# Patient Record
Sex: Male | Born: 1965 | Race: Black or African American | Hispanic: No | Marital: Single | Smoking: Never smoker
Health system: Southern US, Community
[De-identification: ages and names within clinical notes are randomized; demographics above are authoritative.]

## PROBLEM LIST (undated history)

## (undated) DIAGNOSIS — E119 Type 2 diabetes mellitus without complications: Secondary | ICD-10-CM

## (undated) DIAGNOSIS — I10 Essential (primary) hypertension: Secondary | ICD-10-CM

## (undated) DIAGNOSIS — K219 Gastro-esophageal reflux disease without esophagitis: Secondary | ICD-10-CM

## (undated) DIAGNOSIS — M1990 Unspecified osteoarthritis, unspecified site: Secondary | ICD-10-CM

## (undated) DIAGNOSIS — M109 Gout, unspecified: Secondary | ICD-10-CM

## (undated) HISTORY — DX: Unspecified osteoarthritis, unspecified site: M19.90

## (undated) HISTORY — DX: Gout, unspecified: M10.9

## (undated) HISTORY — DX: Essential (primary) hypertension: I10

## (undated) HISTORY — PX: DG OPERATIVE RIGHT HIP (ARMC HX): HXRAD1581

---

## 2006-06-29 ENCOUNTER — Emergency Department: Payer: Self-pay | Admitting: Emergency Medicine

## 2010-01-18 ENCOUNTER — Encounter: Admission: RE | Admit: 2010-01-18 | Discharge: 2010-01-18 | Payer: Self-pay | Admitting: Internal Medicine

## 2011-09-11 ENCOUNTER — Emergency Department: Payer: Self-pay | Admitting: Emergency Medicine

## 2014-02-01 ENCOUNTER — Ambulatory Visit: Payer: Self-pay | Admitting: Internal Medicine

## 2014-07-20 ENCOUNTER — Ambulatory Visit: Payer: Self-pay

## 2014-07-20 LAB — CBC WITH DIFFERENTIAL/PLATELET
BASOS ABS: 0.1 10*3/uL (ref 0.0–0.1)
BASOS PCT: 0.9 %
EOS ABS: 0.1 10*3/uL (ref 0.0–0.7)
Eosinophil %: 2.1 %
HCT: 41.1 % (ref 40.0–52.0)
HGB: 13.8 g/dL (ref 13.0–18.0)
Lymphocyte #: 3.1 10*3/uL (ref 1.0–3.6)
Lymphocyte %: 44.8 %
MCH: 31.6 pg (ref 26.0–34.0)
MCHC: 33.6 g/dL (ref 32.0–36.0)
MCV: 94 fL (ref 80–100)
MONO ABS: 0.3 x10 3/mm (ref 0.2–1.0)
Monocyte %: 4.3 %
Neutrophil #: 3.3 10*3/uL (ref 1.4–6.5)
Neutrophil %: 47.9 %
Platelet: 301 10*3/uL (ref 150–440)
RBC: 4.37 10*6/uL — AB (ref 4.40–5.90)
RDW: 13.5 % (ref 11.5–14.5)
WBC: 6.9 10*3/uL (ref 3.8–10.6)

## 2014-07-20 LAB — COMPREHENSIVE METABOLIC PANEL
ALT: 37 U/L
Albumin: 3.8 g/dL (ref 3.4–5.0)
Alkaline Phosphatase: 86 U/L
Anion Gap: 8 (ref 7–16)
BUN: 13 mg/dL (ref 7–18)
Bilirubin,Total: 1 mg/dL (ref 0.2–1.0)
CALCIUM: 9 mg/dL (ref 8.5–10.1)
CHLORIDE: 101 mmol/L (ref 98–107)
CO2: 27 mmol/L (ref 21–32)
CREATININE: 1.35 mg/dL — AB (ref 0.60–1.30)
EGFR (Non-African Amer.): >60
GLUCOSE: 146 mg/dL — AB (ref 65–99)
Osmolality: 275 (ref 275–301)
POTASSIUM: 3.9 mmol/L (ref 3.5–5.1)
SGOT(AST): 42 U/L — ABNORMAL HIGH (ref 15–37)
Sodium: 136 mmol/L (ref 136–145)
Total Protein: 9 g/dL — ABNORMAL HIGH (ref 6.4–8.2)

## 2014-07-20 LAB — URINALYSIS, COMPLETE
BILIRUBIN, UR: NEGATIVE
Bacteria: NEGATIVE
Glucose,UR: NEGATIVE
KETONE: NEGATIVE
Nitrite: NEGATIVE
PROTEIN: NEGATIVE
Ph: 5.5 (ref 5.0–8.0)
SPECIFIC GRAVITY: 1.02 (ref 1.000–1.030)

## 2015-04-10 ENCOUNTER — Encounter: Payer: Self-pay | Admitting: Emergency Medicine

## 2015-04-10 ENCOUNTER — Emergency Department
Admission: EM | Admit: 2015-04-10 | Discharge: 2015-04-10 | Disposition: A | Discharge status: Home / Self Care | Payer: BC Managed Care – PPO | Attending: Emergency Medicine | Admitting: Emergency Medicine

## 2015-04-10 DIAGNOSIS — M25572 Pain in left ankle and joints of left foot: Secondary | ICD-10-CM | POA: Diagnosis present

## 2015-04-10 DIAGNOSIS — M109 Gout, unspecified: Secondary | ICD-10-CM | POA: Insufficient documentation

## 2015-04-10 MED ORDER — INDOMETHACIN 50 MG PO CAPS: 50.0000 mg | ORAL_CAPSULE | Freq: Two times a day (BID) | ORAL | Status: DC

## 2015-04-10 MED ORDER — COLCHICINE 0.6 MG PO TABS
ORAL_TABLET | ORAL | Status: AC
  Administered 2015-04-10: 0.6 mg via ORAL
  Filled 2015-04-10: qty 1

## 2015-04-10 MED ORDER — INDOMETHACIN 25 MG PO CAPS
50.0000 mg | ORAL_CAPSULE | Freq: Once | ORAL | Status: AC
  Administered 2015-04-10: 50 mg via ORAL
  Filled 2015-04-10: qty 2

## 2015-04-10 MED ORDER — COLCHICINE 0.6 MG PO TABS
0.6000 mg | ORAL_TABLET | Freq: Once | ORAL | Status: AC
  Administered 2015-04-10: 0.6 mg via ORAL

## 2015-04-10 MED ORDER — COLCHICINE 0.6 MG PO TABS: 0.6000 mg | ORAL_TABLET | Freq: Every day | ORAL | Status: DC

## 2015-04-10 NOTE — ED Provider Notes (Signed)
[facility] Emergency Department Provider Note  ____________________________________________  Time seen: Approximately 3:41 AM  I have reviewed the triage vital signs and the nursing notes.   HISTORY  Chief Complaint Joint Pain    HPI Bill [name] is a 49 y.o. male with a history of gout who comes in with some left ankle swelling and pain. The patient reports that the pain started at 2100. The patient did not take anything for pain but reports that he fell asleep. He reports that when he woke up the pain was worse and he could not bear weight. The patient reports that this is typical of his gouty flares. The patient has had gout he reports 3 previous episodes. He reports that he typically gets the symptoms in his ankle. He denies any numbness or tingling or any other pain. He did not have any medication at home for this pain.   History reviewed. No pertinent past medical history.  There are no active problems to display for this patient.   History reviewed. No pertinent past surgical history.  No current outpatient prescriptions on file.  Allergies Review of patient's allergies indicates no known allergies.  No family history on file.  Social History History  Substance Use Topics  . Smoking status: Never Smoker   . Smokeless tobacco: Not on file  . Alcohol Use: No    Review of Systems Constitutional: No fever/chills Eyes: No visual changes. ENT: No sore throat. Cardiovascular: Denies chest pain. Respiratory: Denies shortness of breath. Gastrointestinal: No abdominal pain.  No nausea, no vomiting.   Genitourinary: Negative for dysuria. Musculoskeletal: Left ankle pain Skin: Negative for rash. Neurological: Negative for headaches,  10-point ROS otherwise negative.  ____________________________________________   PHYSICAL EXAM:  VITAL SIGNS: ED Triage Vitals  Enc Vitals Group     BP 04/10/15 0145 180/112 mmHg     Pulse Rate 04/10/15  0145 118     Resp 04/10/15 0145 18     Temp 04/10/15 0145 98.2 F (36.8 C)     Temp Source 04/10/15 0145 Oral     SpO2 04/10/15 0145 98 %     Weight 04/10/15 0145 260 lb (117.935 kg)     Height 04/10/15 0145 5\' 11"  (1.803 m)     Head Cir --      Peak Flow --      Pain Score 04/10/15 0146 8     Pain Loc --      Pain Edu? --      Excl. in [place]? --     Constitutional: Alert and oriented. Well appearing and in no acute distress. Eyes: Conjunctivae are normal. PERRL. EOMI. Head: Atraumatic. Nose: No congestion/rhinnorhea. Mouth/Throat: Mucous membranes are moist.  Oropharynx non-erythematous. Cardiovascular: Normal rate, regular rhythm. Grossly normal heart sounds.  Good peripheral circulation. Respiratory: Normal respiratory effort.  No retractions. Lungs CTAB. Gastrointestinal: Soft and nontender. No distention. No abdominal bruits. No CVA tenderness. Genitourinary: Deferred Musculoskeletal: A and in the left lateral ankle with palpation and range of motion. Mild swelling Neurologic:  Normal speech and language. No gross focal neurologic deficits are appreciated.  Skin:  Skin is warm, dry and intact. No rash noted. Psychiatric: Mood and affect are normal.   ____________________________________________   LABS (all labs ordered are listed, but only abnormal results are displayed)  Labs Reviewed - No data to display ____________________________________________  EKG  None ____________________________________________  RADIOLOGY  None ____________________________________________   PROCEDURES  Procedure(s) performed: None  Critical Care performed:  No  ____________________________________________   INITIAL IMPRESSION / ASSESSMENT AND PLAN / ED COURSE  Pertinent labs & imaging results that were available during my care of the patient were reviewed by me and considered in my medical decision making (see chart for details).  This is a 49 year old male with a history of  gout who comes in today with some left-sided ankle pain and mild swelling which is consistent with his previous gout flares. I will give the patient a dose of indomethacin and culture seen and discharge the patient home with the same medications. The patient is here on crutches for comfort. The patient be discharged follow-up with his primary care physician.  The patient's heart rate is improved. ____________________________________________   FINAL CLINICAL IMPRESSION(S) / ED DIAGNOSES  Final diagnoses:  Gout       Loney Hering, MD 04/10/15 [phone]

## 2015-04-10 NOTE — Discharge Instructions (Signed)

## 2015-04-10 NOTE — ED Notes (Signed)
Patient reports left foot pain.  Pt reports history of gout.

## 2015-04-10 NOTE — ED Notes (Signed)
Pt sitting up on side of stretcher in exam room with no distress noted; reports pain/swelling to left foot since 9pm with no known injury; st hx of same with gout; +PP, brisk cap refill, W&D with good movem/sensation

## 2015-12-01 ENCOUNTER — Ambulatory Visit (INDEPENDENT_AMBULATORY_CARE_PROVIDER_SITE_OTHER): Payer: BC Managed Care – PPO | Admitting: Family Medicine

## 2015-12-01 ENCOUNTER — Encounter: Payer: Self-pay | Admitting: Family Medicine

## 2015-12-01 VITALS — BP 180/88 | HR 90 | Temp 98.3°F | Resp 16 | Ht 71.0 in | Wt 262.8 lb

## 2015-12-01 DIAGNOSIS — Z9109 Other allergy status, other than to drugs and biological substances: Secondary | ICD-10-CM | POA: Insufficient documentation

## 2015-12-01 DIAGNOSIS — Z91048 Other nonmedicinal substance allergy status: Secondary | ICD-10-CM | POA: Diagnosis not present

## 2015-12-01 DIAGNOSIS — Z125 Encounter for screening for malignant neoplasm of prostate: Secondary | ICD-10-CM | POA: Diagnosis not present

## 2015-12-01 DIAGNOSIS — Z8342 Family history of familial hypercholesterolemia: Secondary | ICD-10-CM

## 2015-12-01 DIAGNOSIS — M10072 Idiopathic gout, left ankle and foot: Secondary | ICD-10-CM | POA: Diagnosis not present

## 2015-12-01 DIAGNOSIS — I10 Essential (primary) hypertension: Secondary | ICD-10-CM | POA: Diagnosis not present

## 2015-12-01 DIAGNOSIS — M1A9XX0 Chronic gout, unspecified, without tophus (tophi): Secondary | ICD-10-CM | POA: Insufficient documentation

## 2015-12-01 MED ORDER — AMLODIPINE BESYLATE 5 MG PO TABS: 5.0000 mg | ORAL_TABLET | Freq: Every day | ORAL | Status: DC

## 2015-12-01 MED ORDER — BLOOD PRESSURE CUFF MISC: 1.0000 | Freq: Every day | Status: DC

## 2015-12-01 MED ORDER — INDOMETHACIN 50 MG PO CAPS: 50.0000 mg | ORAL_CAPSULE | Freq: Two times a day (BID) | ORAL | Status: AC

## 2015-12-01 NOTE — Patient Instructions (Signed)
Your goal blood pressure is 140/90 (100/60- 140-90) Work on low salt/sodium diet - goal <2.5gm (12,500mg ) per day. Eat a diet high in fruits/vegetables and whole grains.  Look into mediterranean and DASH diet. Goal activity is 177min/wk of moderate intensity exercise.  This can be split into 30 minute chunks.  If you are not at this level, you can start with smaller 10-15 min increments and slowly build up activity. Look at [employer]org for more resources   Please seek immediate medical attention at ER or Urgent Care if you develop: Chest pain, pressure or tightness. Shortness of breath accompanied by nausea or diaphoresis Visual changes Numbness or tingling on one side of the body Facial droop Altered mental status Or any concerning symptoms.

## 2015-12-01 NOTE — Assessment & Plan Note (Signed)
Start therapy today. CCB due to AAM.  Check CMP. Reviewed DASH diet. Encouraged exercise.  RTc 2 weeks for BP check. Consider ACE/ARB for renal protection.

## 2015-12-01 NOTE — Assessment & Plan Note (Signed)
Continue current medications to help soothe flare. Check Uric acid levels to determine if prophylaxis is needed.

## 2015-12-01 NOTE — Progress Notes (Signed)
Subjective:    Patient ID: Bill [name], male    DOB: 1966/07/24, 50 y.o.   MRN: [id number]  HPI: Bill [name] is a 50 y.o. male presenting on 12/01/2015 for Establish Care   HPI  Pt presents to establish care today. Previous care provider was NONE.  It has been yearsyears since His last PCP visit. Records from previous provider will be requested and reviewed. Current medical problems include:  Gout: Recent gout flare- seen at [facility]. 3rd gout flare in lifetime. 2 in the past 6 mos. Taking indomethicin BID for gout flare. L ankle-swollen but pain is improving. Will finish course on 1/31 Hypertension: Diagnosed recently at [facility]. No Chest pain, SOB, or visual changes. Never been on medication.  Health maintenance:  Would like to screen PSA.  Cologuard info given.  TDAP- >10 years.   Allergies to dogs that shed- no trouble breathing. Runny eyes. No dogs in the house. Probation office-    Past Medical History  Diagnosis Date  . Hypertension   . Gout    Social History   Social History  . Marital Status: Unknown    Spouse Name: N/A  . Number of Children: N/A  . Years of Education: N/A   Occupational History  . Not on file.   Social History Main Topics  . Smoking status: Never Smoker   . Smokeless tobacco: Not on file  . Alcohol Use: No  . Drug Use: No  . Sexual Activity: Not on file   Other Topics Concern  . Not on file   Social History Narrative   Family History  Problem Relation Age of Onset  . Diabetes Father    No current outpatient prescriptions on file prior to visit.   No current facility-administered medications on file prior to visit.    Review of Systems  Constitutional: Negative for fever and chills.  HENT: Negative.   Respiratory: Negative for chest tightness, shortness of breath and wheezing.   Cardiovascular: Negative for chest pain, palpitations and leg swelling.  Gastrointestinal: Negative for nausea, vomiting and abdominal pain.    Endocrine: Negative.   Genitourinary: Negative for dysuria, urgency, discharge, penile pain and testicular pain.  Musculoskeletal: Positive for joint swelling (L ankle.) and arthralgias. Negative for back pain.  Skin: Negative.   Neurological: Negative for dizziness, weakness, numbness and headaches.  Psychiatric/Behavioral: Negative for sleep disturbance and dysphoric mood.   Per HPI unless specifically indicated above     Objective:    BP 180/88 mmHg  Pulse 90  Temp(Src) 98.3 F (36.8 C) (Oral)  Resp 16  Ht '5\' 11"'  (1.803 m)  Wt 262 lb 12.8 oz (119.205 kg)  BMI 36.67 kg/m2  Wt Readings from Last 3 Encounters:  12/01/15 262 lb 12.8 oz (119.205 kg)  04/10/15 260 lb (117.935 kg)    Physical Exam  Constitutional: He is oriented to person, place, and time. He appears well-developed and well-nourished. No distress.  HENT:  Head: Normocephalic and atraumatic.  Neck: Neck supple. No thyromegaly present.  Cardiovascular: Normal rate, regular rhythm and normal heart sounds.  Exam reveals no gallop and no friction rub.   No murmur heard. Pulmonary/Chest: Effort normal and breath sounds normal. He has no wheezes.  Abdominal: Soft. Bowel sounds are normal. He exhibits no distension. There is no tenderness. There is no rebound.  Musculoskeletal: Normal range of motion. He exhibits no edema.       Left ankle: He exhibits swelling. He exhibits normal range of  motion, no ecchymosis, no deformity and normal pulse. Tenderness. Lateral malleolus tenderness found.  Neurological: He is alert and oriented to person, place, and time. He has normal reflexes.  Skin: Skin is warm and dry. No rash noted. No erythema.  Psychiatric: He has a normal mood and affect. His behavior is normal. Thought content normal.   Results for orders placed or performed in visit on 07/20/14  CBC with Differential/Platelet  Result Value Ref Range   WBC 6.9 3.8-10.6 x10 3/mm 3   RBC 4.37 (L) 4.40-5.90 x10 6/mm 3   HGB  13.8 13.0-18.0 g/dL   HCT 41.1 40.0-52.0 %   MCV 94 80-100 fL   MCH 31.6 26.0-34.0 pg   MCHC 33.6 32.0-36.0 g/dL   RDW 13.5 11.5-14.5 %   Platelet 301 150-440 x10 3/mm 3   Neutrophil % 47.9 %   Lymphocyte % 44.8 %   Monocyte % 4.3 %   Eosinophil % 2.1 %   Basophil % 0.9 %   Neutrophil # 3.3 1.4-6.5 x10 3/mm 3   Lymphocyte # 3.1 1.0-3.6 x10 3/mm 3   Monocyte # 0.3 0.2-1.0 x10 3/mm    Eosinophil # 0.1 0.0-0.7 x10 3/mm 3   Basophil # 0.1 0.0-0.1 x10 3/mm 3  Comprehensive metabolic panel  Result Value Ref Range   Glucose 146 (H) 65-99 mg/dL   BUN 13 7-18 mg/dL   Creatinine 1.35 (H) 0.60-1.30 mg/dL   Sodium 136 136-145 mmol/L   Potassium 3.9 3.5-5.1 mmol/L   Chloride 101 98-107 mmol/L   Co2 27 21-32 mmol/L   Calcium, Total 9.0 8.5-10.1 mg/dL   SGOT(AST) 42 (H) 15-37 Unit/L   SGPT (ALT) 37 U/L   Alkaline Phosphatase 86 Unit/L   Albumin 3.8 3.4-5.0 g/dL   Total Protein 9.0 (H) 6.4-8.2 g/dL   Bilirubin,Total 1.0 0.2-1.0 mg/dL   Osmolality 275 275-301   Anion Gap 8 7-16   EGFR (African American) >60    EGFR (Non-African Amer.) >60   Urinalysis, Complete  Result Value Ref Range   Color - urine YELLOW    Clarity - urine HAZY    Glucose,UR NEGATIVE NEGATIVE   Bilirubin,UR NEGATIVE NEGATIVE   Ketone NEGATIVE NEGATIVE   Specific Gravity 1.020 1.000-1.030   Blood TRACE NEGATIVE   Ph 5.5 5.0-8.0   Protein NEGATIVE NEGATIVE   Nitrite NEGATIVE NEGATIVE   Leukocyte Esterase TRACE NEGATIVE   RBC,UR RARE 0-5 /HPF   WBC UR 5-15 0-5 /HPF   Bacteria NEGATIVE NONE SEEN   Squamous Epithelial 5-15 / HPF       Assessment & Plan:   Problem List Items Addressed This Visit      Cardiovascular and Mediastinum   Hypertension    Start therapy today. CCB due to AAM.  Check CMP. Reviewed DASH diet. Encouraged exercise.  RTc 2 weeks for BP check. Consider ACE/ARB for renal protection.       Relevant Medications   amLODipine (NORVASC) 5 MG tablet   Blood Pressure Monitoring (BLOOD  PRESSURE CUFF) MISC     Other   Acute idiopathic gout - Primary    Continue current medications to help soothe flare. Check Uric acid levels to determine if prophylaxis is needed.       Relevant Medications   indomethacin (INDOCIN) 50 MG capsule   Other Relevant Orders   Comprehensive Metabolic Panel (CMET)   Uric acid   Environmental allergies    PRN OTC claritin as needed.       Other Visit Diagnoses  Prostate cancer screening        Shared decision making used. Pt would like to screen PSA.     Relevant Orders    PSA    Family history of high cholesterol        Relevant Orders    Lipid Profile       Meds ordered this encounter  Medications  . colchicine 0.6 MG tablet    Sig: Take 1 tablet in 1 hour. May repeat 2 tablets tomorrow and another 1 tablet 1 hour later PRN for pain  . DISCONTD: indomethacin (INDOCIN) 50 MG capsule    Sig: Take 50 mg by mouth.  . indomethacin (INDOCIN) 50 MG capsule    Sig: Take 1 capsule (50 mg total) by mouth 2 (two) times daily with a meal.    Dispense:  10 capsule    Refill:  1    Order Specific Question:  Supervising Provider    Answer:  JANMICHAEL, GIRAUD [phone]  . amLODipine (NORVASC) 5 MG tablet    Sig: Take 1 tablet (5 mg total) by mouth daily.    Dispense:  30 tablet    Refill:  11    Order Specific Question:  Supervising Provider    Answer:  Arlis Porta [phone]  . Blood Pressure Monitoring (BLOOD PRESSURE CUFF) MISC    Sig: 1 each by Does not apply route daily.    Dispense:  1 each    Refill:  0    Order Specific Question:  Supervising Provider    Answer:  YEHYA, BRENDLE [[id number]]      Follow up plan: Return in about 2 weeks (around 12/15/2015) for BP check.Marland Kitchen

## 2015-12-01 NOTE — Assessment & Plan Note (Signed)
PRN OTC claritin as needed.

## 2015-12-02 LAB — COMPREHENSIVE METABOLIC PANEL
ALBUMIN: 4 g/dL (ref 3.5–5.5)
ALT: 33 IU/L (ref 0–44)
AST: 56 IU/L — ABNORMAL HIGH (ref 0–40)
Albumin/Globulin Ratio: 1 — ABNORMAL LOW (ref 1.1–2.5)
Alkaline Phosphatase: 75 IU/L (ref 39–117)
BILIRUBIN TOTAL: 0.7 mg/dL (ref 0.0–1.2)
BUN/Creatinine Ratio: 11 (ref 9–20)
BUN: 12 mg/dL (ref 6–24)
CO2: 25 mmol/L (ref 18–29)
CREATININE: 1.05 mg/dL (ref 0.76–1.27)
Calcium: 9.4 mg/dL (ref 8.7–10.2)
Chloride: 101 mmol/L (ref 96–106)
GFR, EST AFRICAN AMERICAN: 96 mL/min/{1.73_m2} (ref >59)
GFR, EST NON AFRICAN AMERICAN: 83 mL/min/{1.73_m2} (ref >59)
GLOBULIN, TOTAL: 4.2 g/dL (ref 1.5–4.5)
GLUCOSE: 135 mg/dL — AB (ref 65–99)
POTASSIUM: 4.2 mmol/L (ref 3.5–5.2)
Sodium: 142 mmol/L (ref 134–144)
TOTAL PROTEIN: 8.2 g/dL (ref 6.0–8.5)

## 2015-12-02 LAB — PSA: Prostate Specific Ag, Serum: 0.3 ng/mL (ref 0.0–4.0)

## 2015-12-02 LAB — LIPID PANEL
CHOL/HDL RATIO: 5.9 ratio — AB (ref 0.0–5.0)
Cholesterol, Total: 224 mg/dL — ABNORMAL HIGH (ref 100–199)
HDL: 38 mg/dL — AB (ref >39)
LDL CALC: 160 mg/dL — AB (ref 0–99)
TRIGLYCERIDES: 128 mg/dL (ref 0–149)
VLDL Cholesterol Cal: 26 mg/dL (ref 5–40)

## 2015-12-02 LAB — URIC ACID: URIC ACID: 8.9 mg/dL — AB (ref 3.7–8.6)

## 2016-01-05 ENCOUNTER — Ambulatory Visit (INDEPENDENT_AMBULATORY_CARE_PROVIDER_SITE_OTHER): Payer: BC Managed Care – PPO | Admitting: Family Medicine

## 2016-01-05 ENCOUNTER — Encounter: Payer: Self-pay | Admitting: Family Medicine

## 2016-01-05 VITALS — BP 163/97 | HR 80 | Temp 98.2°F | Resp 16 | Ht 71.0 in | Wt 257.0 lb

## 2016-01-05 DIAGNOSIS — M10072 Idiopathic gout, left ankle and foot: Secondary | ICD-10-CM

## 2016-01-05 DIAGNOSIS — R7301 Impaired fasting glucose: Secondary | ICD-10-CM | POA: Diagnosis not present

## 2016-01-05 DIAGNOSIS — R7401 Elevation of levels of liver transaminase levels: Secondary | ICD-10-CM

## 2016-01-05 DIAGNOSIS — E785 Hyperlipidemia, unspecified: Secondary | ICD-10-CM | POA: Diagnosis not present

## 2016-01-05 DIAGNOSIS — R740 Nonspecific elevation of levels of transaminase and lactic acid dehydrogenase [LDH]: Secondary | ICD-10-CM

## 2016-01-05 DIAGNOSIS — I10 Essential (primary) hypertension: Secondary | ICD-10-CM

## 2016-01-05 MED ORDER — ALLOPURINOL 100 MG PO TABS: 100.0000 mg | ORAL_TABLET | Freq: Every day | ORAL | Status: DC

## 2016-01-05 MED ORDER — COLCHICINE 0.6 MG PO TABS: 0.6000 mg | ORAL_TABLET | Freq: Every day | ORAL | Status: DC

## 2016-01-05 MED ORDER — AMLODIPINE BESYLATE 10 MG PO TABS: 10.0000 mg | ORAL_TABLET | Freq: Every day | ORAL | Status: DC

## 2016-01-05 NOTE — Patient Instructions (Signed)
Gout: We will start allopurinol 100mg  once daily.  Take colchicine 0.6mg  once daily to prevent a gout flare.  We will recheck your uric acid and kidney function in 1 mos.  BP: Increase your amlodipine to 10mg  daily.  Take 2 pills until your current bottle is empty and switch to new prescription.  We will recheck your lab work.   Your goal blood pressure is 140/90 Work on low salt/sodium diet - goal <1.5gm (1,500mg ) per day. Eat a diet high in fruits/vegetables and whole grains.  Look into mediterranean and DASH diet. Goal activity is 180min/wk of moderate intensity exercise.  This can be split into 30 minute chunks.  If you are not at this level, you can start with smaller 10-15 min increments and slowly build up activity. Look at [place].org for more resources  Please seek immediate medical attention at ER or Urgent Care if you develop: Chest pain, pressure or tightness. Shortness of breath accompanied by nausea or diaphoresis Visual changes Numbness or tingling on one side of the body Facial droop Altered mental status Or any concerning symptoms.

## 2016-01-05 NOTE — Progress Notes (Signed)
Subjective:    Patient ID: Bill [name], male    DOB: September 18, 1966, 50 y.o.   MRN: [id number]  HPI: Bill [name] is a 50 y.o. male presenting on 01/05/2016 for Follow-up   HPI  Pt presents for follow-up of HTN and gout. He was also found to have elevated cholesterol on labwork.  HTN: No chest pain, SOB, or visual changes. Tolerating amlodipine well. Trying to make diet and lifestyle changes. Taking medication daily. Gout: Recent flare resolved with indomethicin. Doing well. Uric acid levels were high- would like to start gout prophylaxis today.    Past Medical History  Diagnosis Date  . Hypertension   . Gout     Current Outpatient Prescriptions on File Prior to Visit  Medication Sig  . Blood Pressure Monitoring (BLOOD PRESSURE CUFF) MISC 1 each by Does not apply route daily.   No current facility-administered medications on file prior to visit.    Review of Systems  Constitutional: Negative for fever and chills.  HENT: Negative.   Respiratory: Negative for chest tightness, shortness of breath and wheezing.   Cardiovascular: Negative for chest pain, palpitations and leg swelling.  Gastrointestinal: Negative for nausea, vomiting and abdominal pain.  Endocrine: Negative.   Genitourinary: Negative for dysuria, urgency, discharge, penile pain and testicular pain.  Musculoskeletal: Negative for back pain, joint swelling and arthralgias.  Skin: Negative.   Neurological: Negative for dizziness, weakness, numbness and headaches.  Psychiatric/Behavioral: Negative for sleep disturbance and dysphoric mood.   Per HPI unless specifically indicated above     Objective:    BP 163/97 mmHg  Pulse 80  Temp(Src) 98.2 F (36.8 C) (Oral)  Resp 16  Ht 5\' 11"  (1.803 m)  Wt 257 lb (116.574 kg)  BMI 35.86 kg/m2  Wt Readings from Last 3 Encounters:  01/05/16 257 lb (116.574 kg)  12/01/15 262 lb 12.8 oz (119.205 kg)  04/10/15 260 lb (117.935 kg)    Physical Exam  Constitutional: He is  oriented to person, place, and time. He appears well-developed and well-nourished. No distress.  HENT:  Head: Normocephalic and atraumatic.  Neck: Neck supple. No thyromegaly present.  Cardiovascular: Normal rate, regular rhythm and normal heart sounds.  Exam reveals no gallop and no friction rub.   No murmur heard. Pulmonary/Chest: Effort normal and breath sounds normal. He has no wheezes.  Abdominal: Soft. Bowel sounds are normal. He exhibits no distension. There is no tenderness. There is no rebound.  Musculoskeletal: Normal range of motion. He exhibits no edema or tenderness.  Neurological: He is alert and oriented to person, place, and time. He has normal reflexes.  Skin: Skin is warm and dry. No rash noted. No erythema.  Psychiatric: He has a normal mood and affect. His behavior is normal. Thought content normal.   Results for orders placed or performed in visit on 12/01/15  Comprehensive Metabolic Panel (CMET)  Result Value Ref Range   Glucose 135 (H) 65 - 99 mg/dL   BUN 12 6 - 24 mg/dL   Creatinine, Ser 1.05 0.76 - 1.27 mg/dL   GFR calc non Af Amer 83 >59 mL/min/1.73   GFR calc Af Amer 96 >59 mL/min/1.73   BUN/Creatinine Ratio 11 9 - 20   Sodium 142 134 - 144 mmol/L   Potassium 4.2 3.5 - 5.2 mmol/L   Chloride 101 96 - 106 mmol/L   CO2 25 18 - 29 mmol/L   Calcium 9.4 8.7 - 10.2 mg/dL   Total Protein 8.2 6.0 -  8.5 g/dL   Albumin 4.0 3.5 - 5.5 g/dL   Globulin, Total 4.2 1.5 - 4.5 g/dL   Albumin/Globulin Ratio 1.0 (L) 1.1 - 2.5   Bilirubin Total 0.7 0.0 - 1.2 mg/dL   Alkaline Phosphatase 75 39 - 117 IU/L   AST 56 (H) 0 - 40 IU/L   ALT 33 0 - 44 IU/L  Lipid Profile  Result Value Ref Range   Cholesterol, Total 224 (H) 100 - 199 mg/dL   Triglycerides 128 0 - 149 mg/dL   HDL 38 (L) >39 mg/dL   VLDL Cholesterol Cal 26 5 - 40 mg/dL   LDL Calculated 160 (H) 0 - 99 mg/dL   Chol/HDL Ratio 5.9 (H) 0.0 - 5.0 ratio units  Uric acid  Result Value Ref Range   Uric Acid 8.9 (H) 3.7  - 8.6 mg/dL  PSA  Result Value Ref Range   Prostate Specific Ag, Serum 0.3 0.0 - 4.0 ng/mL      Assessment & Plan:   Problem List Items Addressed This Visit      Cardiovascular and Mediastinum   Hypertension    Increase amlodipine to 10mg  daily. Reviewed dash diet and lifestyle changes. Alarm symptoms reviewed. Recheck 1 mos.       Relevant Medications   amLODipine (NORVASC) 10 MG tablet     Other   Chronic gout - Primary    Start prophylaxis due to hyperurcemia. Allopurinol 100mg  once daily and colchicine 0.6mg  daily. Reviewed risks,benefits, and side effects.  Plan to increase to 300mg  daily as tolerated. If gout flare occurs increase to BID colchicine.       Relevant Medications   colchicine 0.6 MG tablet   allopurinol (ZYLOPRIM) 100 MG tablet   Hyperlipemia    Plan to start atorvastatin 20mg  pending LFts. Discussed risks, benefits, and side effects.       Relevant Medications   amLODipine (NORVASC) 10 MG tablet    Other Visit Diagnoses    Elevated fasting glucose        Screen A1c.     Relevant Orders    Hemoglobin A1c    Elevated transaminase level        Recheck LFTs.    Relevant Orders    Hepatic function panel       Meds ordered this encounter  Medications  . colchicine 0.6 MG tablet    Sig: Take 1 tablet (0.6 mg total) by mouth daily.    Dispense:  30 tablet    Refill:  11    Order Specific Question:  Supervising Provider    Answer:  Arlis Porta [phone]  . allopurinol (ZYLOPRIM) 100 MG tablet    Sig: Take 1 tablet (100 mg total) by mouth daily.    Dispense:  30 tablet    Refill:  11    Order Specific Question:  Supervising Provider    Answer:  Arlis Porta [phone]  . amLODipine (NORVASC) 10 MG tablet    Sig: Take 1 tablet (10 mg total) by mouth daily.    Dispense:  90 tablet    Refill:  3    Order Specific Question:  Supervising Provider    Answer:  DAVINDER, MIGNEAULT [id number]      Follow up plan: Return in about 4 weeks  (around 02/02/2016), or if symptoms worsen or fail to improve, for Gout.

## 2016-01-05 NOTE — Assessment & Plan Note (Signed)
Plan to start atorvastatin 20mg  pending LFts. Discussed risks, benefits, and side effects.

## 2016-01-05 NOTE — Assessment & Plan Note (Signed)
Increase amlodipine to 10mg  daily. Reviewed dash diet and lifestyle changes. Alarm symptoms reviewed. Recheck 1 mos.

## 2016-01-05 NOTE — Assessment & Plan Note (Signed)
Start prophylaxis due to hyperurcemia. Allopurinol 100mg  once daily and colchicine 0.6mg  daily. Reviewed risks,benefits, and side effects.  Plan to increase to 300mg  daily as tolerated. If gout flare occurs increase to BID colchicine.

## 2016-12-26 ENCOUNTER — Ambulatory Visit (INDEPENDENT_AMBULATORY_CARE_PROVIDER_SITE_OTHER): Payer: BC Managed Care – PPO | Admitting: Family Medicine

## 2016-12-26 ENCOUNTER — Encounter: Payer: Self-pay | Admitting: Family Medicine

## 2016-12-26 VITALS — BP 170/100 | HR 95 | Temp 98.8°F | Resp 16 | Ht 71.0 in | Wt 266.0 lb

## 2016-12-26 DIAGNOSIS — M25551 Pain in right hip: Secondary | ICD-10-CM

## 2016-12-26 DIAGNOSIS — I10 Essential (primary) hypertension: Secondary | ICD-10-CM

## 2016-12-26 DIAGNOSIS — M1651 Unilateral post-traumatic osteoarthritis, right hip: Secondary | ICD-10-CM

## 2016-12-26 DIAGNOSIS — Z8781 Personal history of (healed) traumatic fracture: Secondary | ICD-10-CM

## 2016-12-26 DIAGNOSIS — M25561 Pain in right knee: Secondary | ICD-10-CM | POA: Diagnosis not present

## 2016-12-26 DIAGNOSIS — G8929 Other chronic pain: Secondary | ICD-10-CM | POA: Diagnosis not present

## 2016-12-26 MED ORDER — AMLODIPINE BESYLATE 10 MG PO TABS: 10.0000 mg | ORAL_TABLET | Freq: Every day | ORAL | 3 refills | Status: DC

## 2016-12-26 MED ORDER — NAPROXEN 500 MG PO TABS: 500.0000 mg | ORAL_TABLET | Freq: Two times a day (BID) | ORAL | 2 refills | Status: DC
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
## 2016-12-27 ENCOUNTER — Ambulatory Visit: Payer: BC Managed Care – PPO | Admitting: Family Medicine

## 2016-12-27 ENCOUNTER — Encounter: Payer: Self-pay | Admitting: Family Medicine

## 2016-12-27 NOTE — Assessment & Plan Note (Signed)
Likely etiology for chronic post-op / arthritis pain. See A&P

## 2016-12-27 NOTE — Assessment & Plan Note (Signed)
Significantly elevated BP, mild improved on manual re-check but still elevated. Non adherence to amlodipine. Pain with R hip/knee - No known complications  Plan: 1. Refilled Amlodipine, resume 10mg  daily for now 2. Monitor BP outside office, write readings, if persistently >160/100, call or return sooner for BP re-check 3. Encouraged healthy lifestyle, reviewed HTN factors, concern with pain and limited exercise for now 4. Follow-up 4-6 weeks for HTN, likely will need 2nd agent for BP
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
## 2016-12-27 NOTE — Assessment & Plan Note (Signed)
Likely etiology of osteoarthritis contributing to R hip pain in post-op setting

## 2017-01-02 ENCOUNTER — Ambulatory Visit: Payer: BC Managed Care – PPO | Admitting: Family Medicine

## 2018-09-18 ENCOUNTER — Other Ambulatory Visit: Payer: Self-pay | Admitting: Family Medicine

## 2018-09-18 DIAGNOSIS — I10 Essential (primary) hypertension: Secondary | ICD-10-CM

## 2018-09-18 NOTE — Telephone Encounter (Signed)
Pt called requesting refill on BP medication called in [facility]

## 2018-09-22 MED ORDER — AMLODIPINE BESYLATE 10 MG PO TABS: 10.0000 mg | ORAL_TABLET | Freq: Every day | ORAL | 0 refills | Status: DC

## 2018-11-23 ENCOUNTER — Telehealth: Payer: Self-pay | Admitting: Family Medicine

## 2018-11-23 NOTE — Telephone Encounter (Signed)
Pt called requesting refill for  Mediation for gout called into [facility].Pt call back # is [phone]

## 2018-11-23 NOTE — Telephone Encounter (Signed)
Reviewed chart, last seen 12/2016. He will need office visit for any refills, need to discuss his gout and treatment options.  Nobie Putnam, [facility] 11/23/2018, 4:59 PM

## 2019-04-04 ENCOUNTER — Inpatient Hospital Stay: Admission: RE | Admit: 2019-04-04 | Payer: BC Managed Care – PPO | Source: Ambulatory Visit

## 2019-04-04 ENCOUNTER — Ambulatory Visit (INDEPENDENT_AMBULATORY_CARE_PROVIDER_SITE_OTHER)
Admission: RE | Admit: 2019-04-04 | Discharge: 2019-04-04 | Disposition: A | Discharge status: Home / Self Care | Payer: BC Managed Care – PPO | Source: Ambulatory Visit

## 2019-04-04 ENCOUNTER — Inpatient Hospital Stay
Admission: RE | Admit: 2019-04-04 | Discharge: 2019-04-04 | Disposition: A | Discharge status: Home / Self Care | Payer: BC Managed Care – PPO | Source: Ambulatory Visit

## 2019-04-04 DIAGNOSIS — M109 Gout, unspecified: Secondary | ICD-10-CM

## 2019-04-04 MED ORDER — INDOMETHACIN 50 MG PO CAPS: 50.0000 mg | ORAL_CAPSULE | Freq: Two times a day (BID) | ORAL | 0 refills | Status: AC

## 2019-04-04 NOTE — ED Provider Notes (Signed)
Virtual Visit via Video Note:  Bill [name]  initiated request for Telemedicine visit with [facility] Urgent Care team. I connected with Bill [name]  on 04/04/2019 at 2:29 PM  for a synchronized telemedicine visit using a video enabled HIPPA compliant telemedicine application. I verified that I am speaking with Bill [name]  using two identifiers. Zigmund Gottron, NP  was physically located in a [facility] Urgent care site and Bill [name] was located at a different location.   The limitations of evaluation and management by telemedicine as well as the availability of in-person appointments were discussed. Patient was informed that he  may incur a bill ( including co-pay) for this virtual visit encounter. Bill [name]  expressed understanding and gave verbal consent to proceed with virtual visit.     History of Present Illness:Bill [name]  is a 53 y.o. male presents with complaints of gout flair. Foot pain. Started yesterday. Left ankle. No injury. Has had gout in varying locations which feels similar. Swollen, no redness. No fevers. No numbness tingling weakness to food. 9/10. Tylenol, which didn't help. Last episode of gout approximately 1 year ago. No known triggers. No alcohol intake in the past 1 month. Drinking plenty of water. Pain with weight bearing.   Past Medical History:  Diagnosis Date  . Gout   . Hypertension     No Known Allergies      Observations/Objective: Alert, oriented, non toxic in appearance. Clear coherent speech without difficulty. No increased work of breathing visualized.  Ankle visualized with apparent swelling, no obvious redness, moving toes without difficulty.   Assessment and Plan: Consistent with gout. Will treat with with indomethacin, as patient states this has helped most significantly in the past. Return precautions provided. Encouraged follow up for persistent or worsening of symptoms. Patient verbalized understanding and agreeable to plan.     Follow Up Instructions:    I discussed the assessment and treatment plan with the patient. The patient was provided an opportunity to ask questions and all were answered. The patient agreed with the plan and demonstrated an understanding of the instructions.   The patient was advised to call back or seek an in-person evaluation if the symptoms worsen or if the condition fails to improve as anticipated.  I provided 13 minutes of non-face-to-face time during this encounter.    Zigmund Gottron, NP  04/04/2019 2:29 PM         Zigmund Gottron, NP 04/04/19 1431

## 2019-04-04 NOTE — Discharge Instructions (Signed)
Ice, elevation to help with with pain.  Indomethacin twice a day, take with food. Stop taking once symptoms have improved.  May use tylenol as well as needed. See attached diet recommendations to help with gout prevention.  If persistent symptoms, fever development, warmth redness or otherwise worsening please be seen in person.

## 2019-05-17 ENCOUNTER — Ambulatory Visit (INDEPENDENT_AMBULATORY_CARE_PROVIDER_SITE_OTHER)
Admission: RE | Admit: 2019-05-17 | Discharge: 2019-05-17 | Disposition: A | Discharge status: Home / Self Care | Payer: BC Managed Care – PPO | Source: Ambulatory Visit

## 2019-05-17 DIAGNOSIS — M109 Gout, unspecified: Secondary | ICD-10-CM

## 2019-05-17 MED ORDER — TRAMADOL HCL 50 MG PO TABS: 50.0000 mg | ORAL_TABLET | Freq: Four times a day (QID) | ORAL | 0 refills | Status: AC | PRN

## 2019-05-17 MED ORDER — TRAMADOL HCL 50 MG PO TABS: 50.0000 mg | ORAL_TABLET | Freq: Four times a day (QID) | ORAL | 0 refills | Status: DC | PRN

## 2019-05-17 MED ORDER — PREDNISONE 10 MG (21) PO TBPK: ORAL_TABLET | ORAL | 0 refills | Status: DC

## 2019-05-17 NOTE — Discharge Instructions (Signed)
Treating you for a gout flare.  Take the medication as prescribed Follow up as needed for continued or worsening symptoms

## 2019-05-17 NOTE — ED Provider Notes (Signed)
Virtual Visit via Video Note:  Bill [name]  initiated request for Telemedicine visit with [facility] Urgent Care team. I connected with Bill [name]  on 05/17/2019 at 9:57 AM  for a synchronized telemedicine visit using a video enabled HIPPA compliant telemedicine application. I verified that I am speaking with Bill [name]  using two identifiers. Bill July, NP  was physically located in a [facility] Urgent care site and Bill [name] was located at a different location.   The limitations of evaluation and management by telemedicine as well as the availability of in-person appointments were discussed. Patient was informed that he  may incur a bill ( including co-pay) for this virtual visit encounter. Bill [name]  expressed understanding and gave verbal consent to proceed with virtual visit.     History of Present Illness:Bill [name]  is a 53 y.o. male presents with acute gout flare.  Reporting this started approximately 2 days ago.  The pain and swelling is in the left ankle.  Reporting symptoms consistent with previous gout flareups.  Denies any injuries to the foot or ankle.  Denies any rashes or fever.  Denies any numbness, tingling or decrease in sensation.  Reporting temperature of the foot is normal.  Past Medical History:  Diagnosis Date  . Gout   . Hypertension     No Known Allergies      Observations/Objective:GENERAL APPEARANCE: Well developed, well nourished, alert and cooperative, and appears to be in no acute distress. HEAD: normocephalic. Non labored breathing, no dyspnea or distress Skin: Skin normal color  PSYCHIATRIC: The mental examination revealed the patient was oriented to person, place, and time. The patient was able to demonstrate good judgement and reason, without hallucinations, abnormal affect or abnormal behaviors during the examination. Patient is not suicidal     Assessment and Plan: Treating for gout flare with prednisone. Tramadol as needed for  more severe pain.  Follow Up Instructions: Recommended follow-up in person for continued or worsening problems.    I discussed the assessment and treatment plan with the patient. The patient was provided an opportunity to ask questions and all were answered. The patient agreed with the plan and demonstrated an understanding of the instructions.   The patient was advised to call back or seek an in-person evaluation if the symptoms worsen or if the condition fails to improve as anticipated.      Bill July, NP  05/17/2019 9:57 AM         Bill July, NP 05/18/19 [phone]

## 2019-06-24 ENCOUNTER — Other Ambulatory Visit: Payer: Self-pay

## 2019-06-24 ENCOUNTER — Ambulatory Visit (INDEPENDENT_AMBULATORY_CARE_PROVIDER_SITE_OTHER)
Admission: RE | Admit: 2019-06-24 | Discharge: 2019-06-24 | Disposition: A | Discharge status: Home / Self Care | Payer: BC Managed Care – PPO | Source: Ambulatory Visit

## 2019-06-24 DIAGNOSIS — M109 Gout, unspecified: Secondary | ICD-10-CM | POA: Diagnosis not present

## 2019-06-24 DIAGNOSIS — M25472 Effusion, left ankle: Secondary | ICD-10-CM

## 2019-06-24 DIAGNOSIS — M25572 Pain in left ankle and joints of left foot: Secondary | ICD-10-CM

## 2019-06-24 DIAGNOSIS — R739 Hyperglycemia, unspecified: Secondary | ICD-10-CM

## 2019-06-24 DIAGNOSIS — Z8639 Personal history of other endocrine, nutritional and metabolic disease: Secondary | ICD-10-CM | POA: Diagnosis not present

## 2019-06-24 MED ORDER — COLCHICINE 0.6 MG PO TABS: ORAL_TABLET | ORAL | 0 refills | Status: DC

## 2019-06-24 NOTE — ED Provider Notes (Signed)
Virtual Visit via Video Note:  Bill [name]  initiated request for Telemedicine visit with [facility] team. I connected with Bill [name]  on 06/24/2019 at 6:07 PM  for a synchronized telemedicine visit using a video enabled HIPPA compliant telemedicine application. I verified that I am speaking with Bill [name]  using two identifiers. Bill Eagles, PA-C  was physically located in a [facility] Urgent care site and Bill [name] was located at a different location.   The limitations of evaluation and management by telemedicine as well as the availability of in-person appointments were discussed. Patient was informed that he  may incur a bill ( including co-pay) for this virtual visit encounter. Bill [name]  expressed understanding and gave verbal consent to proceed with virtual visit.     History of Present Illness:Bill [name]  is a 53 y.o. male presents with 2-day history of cute onset of left ankle pain, swelling.  Patient has a history of gout but has not gotten regular care.  The last uric acid level he had checked was 3 years ago and was elevated at 8.9.  He also had elevated blood sugar than.  Patient has not been to a provider within that time.  For his last gout attack use prednisone.  Denies any trauma, redness.  Denies kidney disease.  Denies alcohol use.  ROS  No current facility-administered medications for this encounter.    Current Outpatient Medications  Medication Sig Dispense Refill  . amLODipine (NORVASC) 10 MG tablet Take 1 tablet (10 mg total) by mouth daily. 90 tablet 0  . Blood Pressure Monitoring (BLOOD PRESSURE CUFF) MISC 1 each by Does not apply route daily. 1 each 0  . naproxen (NAPROSYN) 500 MG tablet Take 1 tablet (500 mg total) by mouth 2 (two) times daily with a meal. For 2-4 weeks then as needed 60 tablet 2  . predniSONE (STERAPRED UNI-PAK 21 TAB) 10 MG (21) TBPK tablet 6 tabs for 1 day, then 5 tabs for 1 das, then 4 tabs for 1 day, then 3 tabs for  1 day, 2 tabs for 1 day, then 1 tab for 1 day 21 tablet 0     No Known Allergies    Past Medical History:  Diagnosis Date  . Gout   . Hypertension     Past Surgical History:  Procedure Laterality Date  . DG OPERATIVE RIGHT HIP ([place] HX)        Observations/Objective: Physical Exam Constitutional:      General: He is not in acute distress.    Appearance: Normal appearance. He is obese. He is not ill-appearing, toxic-appearing or diaphoretic.  Musculoskeletal:     Left ankle: He exhibits swelling (Throughout).     Comments: Video quality difficult to evaluate erythema.  But patient reports no warmth.  Neurological:     Mental Status: He is alert and oriented to person, place, and time.  Psychiatric:        Mood and Affect: Mood normal.        Behavior: Behavior normal.      Assessment and Plan:  1. Acute left ankle pain   2. Left ankle swelling   3. Acute gout of left ankle, unspecified cause   4. Elevated blood sugar   5. Morbid obesity ([place])    Will start colchicine for gout attack.  Patient needs to establish care, emphasized at this point and he was agreeable to contacting [facility].  Dosing instructions reviewed with patient. Counseled patient on potential for adverse effects with medications prescribed/recommended today, ER and return-to-clinic precautions discussed, patient verbalized understanding.    Follow Up Instructions:    I discussed the assessment and treatment plan with the patient. The patient was provided an opportunity to ask questions and all were answered. The patient agreed with the plan and demonstrated an understanding of the instructions.   The patient was advised to call back or seek an in-person evaluation if the symptoms worsen or if the condition fails to improve as anticipated.  I provided 15 minutes of non-face-to-face time during this encounter.    Bill Eagles, PA-C  06/24/2019 6:07 PM         Bill Eagles,  PA-C 06/24/19 1820

## 2019-12-15 ENCOUNTER — Other Ambulatory Visit: Payer: Self-pay

## 2019-12-16 ENCOUNTER — Ambulatory Visit: Payer: BC Managed Care – PPO | Admitting: Internal Medicine

## 2019-12-16 ENCOUNTER — Encounter: Payer: Self-pay | Admitting: Gastroenterology

## 2019-12-16 ENCOUNTER — Encounter: Payer: Self-pay | Admitting: Internal Medicine

## 2019-12-16 VITALS — BP 170/110 | HR 104 | Temp 97.5°F | Ht 71.0 in | Wt 256.2 lb

## 2019-12-16 DIAGNOSIS — E785 Hyperlipidemia, unspecified: Secondary | ICD-10-CM

## 2019-12-16 DIAGNOSIS — M25551 Pain in right hip: Secondary | ICD-10-CM

## 2019-12-16 DIAGNOSIS — G8929 Other chronic pain: Secondary | ICD-10-CM

## 2019-12-16 DIAGNOSIS — Z1211 Encounter for screening for malignant neoplasm of colon: Secondary | ICD-10-CM | POA: Diagnosis not present

## 2019-12-16 DIAGNOSIS — G4730 Sleep apnea, unspecified: Secondary | ICD-10-CM

## 2019-12-16 DIAGNOSIS — M25561 Pain in right knee: Secondary | ICD-10-CM

## 2019-12-16 DIAGNOSIS — I10 Essential (primary) hypertension: Secondary | ICD-10-CM | POA: Diagnosis not present

## 2019-12-16 DIAGNOSIS — R0683 Snoring: Secondary | ICD-10-CM

## 2019-12-16 MED ORDER — HYDROCHLOROTHIAZIDE 25 MG PO TABS: 25.0000 mg | ORAL_TABLET | Freq: Every day | ORAL | 1 refills | Status: DC

## 2019-12-16 MED ORDER — LISINOPRIL 10 MG PO TABS: 10.0000 mg | ORAL_TABLET | Freq: Every day | ORAL | 1 refills | Status: DC

## 2019-12-16 NOTE — Patient Instructions (Signed)
-Nice seeing you today!!  -Start HCTZ 25 mg daily.  -START lisinopril 10 mg daily.  -Low salt diet (see below).  -Return in 8 weeks for your physical. Please come in fasting that day.   DASH Eating Plan DASH stands for "Dietary Approaches to Stop Hypertension." The DASH eating plan is a healthy eating plan that has been shown to reduce high blood pressure (hypertension). It may also reduce your risk for type 2 diabetes, heart disease, and stroke. The DASH eating plan may also help with weight loss. What are tips for following this plan?  General guidelines  Avoid eating more than 2,300 mg (milligrams) of salt (sodium) a day. If you have hypertension, you may need to reduce your sodium intake to 1,500 mg a day.  Limit alcohol intake to no more than 1 drink a day for nonpregnant women and 2 drinks a day for men. One drink equals 12 oz of beer, 5 oz of wine, or 1 oz of hard liquor.  Work with your health care provider to maintain a healthy body weight or to lose weight. Ask what an ideal weight is for you.  Get at least 30 minutes of exercise that causes your heart to beat faster (aerobic exercise) most days of the week. Activities may include walking, swimming, or biking.  Work with your health care provider or diet and nutrition specialist (dietitian) to adjust your eating plan to your individual calorie needs. Reading food labels   Check food labels for the amount of sodium per serving. Choose foods with less than 5 percent of the Daily Value of sodium. Generally, foods with less than 300 mg of sodium per serving fit into this eating plan.  To find whole grains, look for the word "whole" as the first word in the ingredient list. Shopping  Buy products labeled as "low-sodium" or "no salt added."  Buy fresh foods. Avoid canned foods and premade or frozen meals. Cooking  Avoid adding salt when cooking. Use salt-free seasonings or herbs instead of table salt or sea salt. Check  with your health care provider or pharmacist before using salt substitutes.  Do not fry foods. Cook foods using healthy methods such as baking, boiling, grilling, and broiling instead.  Cook with heart-healthy oils, such as olive, canola, soybean, or sunflower oil. Meal planning  Eat a balanced diet that includes: ? 5 or more servings of fruits and vegetables each day. At each meal, try to fill half of your plate with fruits and vegetables. ? Up to 6-8 servings of whole grains each day. ? Less than 6 oz of lean meat, poultry, or fish each day. A 3-oz serving of meat is about the same size as a deck of cards. One egg equals 1 oz. ? 2 servings of low-fat dairy each day. ? A serving of nuts, seeds, or beans 5 times each week. ? Heart-healthy fats. Healthy fats called Omega-3 fatty acids are found in foods such as flaxseeds and coldwater fish, like sardines, salmon, and mackerel.  Limit how much you eat of the following: ? Canned or prepackaged foods. ? Food that is high in trans fat, such as fried foods. ? Food that is high in saturated fat, such as fatty meat. ? Sweets, desserts, sugary drinks, and other foods with added sugar. ? Full-fat dairy products.  Do not salt foods before eating.  Try to eat at least 2 vegetarian meals each week.  Eat more home-cooked food and less restaurant, buffet, and fast food.  When eating at a restaurant, ask that your food be prepared with less salt or no salt, if possible. What foods are recommended? The items listed may not be a complete list. Talk with your dietitian about what dietary choices are best for you. Grains Whole-grain or whole-wheat bread. Whole-grain or whole-wheat pasta. Brown rice. [place]. Bulgur. Whole-grain and low-sodium cereals. Pita bread. Low-fat, low-sodium crackers. Whole-wheat flour tortillas. Vegetables Fresh or frozen vegetables (raw, steamed, roasted, or grilled). Low-sodium or reduced-sodium tomato and vegetable  juice. Low-sodium or reduced-sodium tomato sauce and tomato paste. Low-sodium or reduced-sodium canned vegetables. Fruits All fresh, dried, or frozen fruit. Canned fruit in natural juice (without added sugar). Meat and other protein foods Skinless chicken or [place]. Ground chicken or [place]. Pork with fat trimmed off. Fish and seafood. Egg whites. Dried beans, peas, or lentils. Unsalted nuts, nut butters, and seeds. Unsalted canned beans. Lean cuts of beef with fat trimmed off. Low-sodium, lean deli meat. Dairy Low-fat (1%) or fat-free (skim) milk. Fat-free, low-fat, or reduced-fat cheeses. Nonfat, low-sodium ricotta or cottage cheese. Low-fat or nonfat yogurt. Low-fat, low-sodium cheese. Fats and oils Soft margarine without trans fats. Vegetable oil. Low-fat, reduced-fat, or light mayonnaise and salad dressings (reduced-sodium). Canola, safflower, olive, soybean, and sunflower oils. Avocado. Seasoning and other foods Herbs. Spices. Seasoning mixes without salt. Unsalted popcorn and pretzels. Fat-free sweets. What foods are not recommended? The items listed may not be a complete list. Talk with your dietitian about what dietary choices are best for you. Grains Baked goods made with fat, such as croissants, muffins, or some breads. Dry pasta or rice meal packs. Vegetables Creamed or fried vegetables. Vegetables in a cheese sauce. Regular canned vegetables (not low-sodium or reduced-sodium). Regular canned tomato sauce and paste (not low-sodium or reduced-sodium). Regular tomato and vegetable juice (not low-sodium or reduced-sodium). Angie Fava. Olives. Fruits Canned fruit in a light or heavy syrup. Fried fruit. Fruit in cream or butter sauce. Meat and other protein foods Fatty cuts of meat. Ribs. Fried meat. Berniece Salines. Sausage. Bologna and other processed lunch meats. Salami. Fatback. Hotdogs. Bratwurst. Salted nuts and seeds. Canned beans with added salt. Canned or smoked fish. Whole eggs or egg yolks.  Chicken or [place] with skin. Dairy Whole or 2% milk, cream, and half-and-half. Whole or full-fat cream cheese. Whole-fat or sweetened yogurt. Full-fat cheese. Nondairy creamers. Whipped toppings. Processed cheese and cheese spreads. Fats and oils Butter. Stick margarine. Lard. Shortening. Ghee. Bacon fat. Tropical oils, such as coconut, palm kernel, or palm oil. Seasoning and other foods Salted popcorn and pretzels. Onion salt, garlic salt, seasoned salt, table salt, and sea salt. Worcestershire sauce. Tartar sauce. Barbecue sauce. Teriyaki sauce. Soy sauce, including reduced-sodium. Steak sauce. Canned and packaged gravies. Fish sauce. Oyster sauce. Cocktail sauce. Horseradish that you find on the shelf. Ketchup. Mustard. Meat flavorings and tenderizers. Bouillon cubes. Hot sauce and Tabasco sauce. Premade or packaged marinades. Premade or packaged taco seasonings. Relishes. Regular salad dressings. Where to find more information:  National Heart, Lung, and [place]: https://wilson-eaton.com/  American Heart Association: www.heart.org Summary  The DASH eating plan is a healthy eating plan that has been shown to reduce high blood pressure (hypertension). It may also reduce your risk for type 2 diabetes, heart disease, and stroke.  With the DASH eating plan, you should limit salt (sodium) intake to 2,300 mg a day. If you have hypertension, you may need to reduce your sodium intake to 1,500 mg a day.  When on the DASH eating plan,  aim to eat more fresh fruits and vegetables, whole grains, lean proteins, low-fat dairy, and heart-healthy fats.  Work with your health care provider or diet and nutrition specialist (dietitian) to adjust your eating plan to your individual calorie needs. This information is not intended to replace advice given to you by your health care provider. Make sure you discuss any questions you have with your health care provider. Document Revised: 10/03/2017 Document Reviewed:  10/14/2016 Elsevier Patient Education  2020 [employer].

## 2019-12-16 NOTE — Progress Notes (Signed)
New Patient Office Visit     This visit occurred during the SARS-CoV-2 public health emergency.  Safety protocols were in place, including screening questions prior to the visit, additional usage of staff PPE, and extensive cleaning of exam room while observing appropriate contact time as indicated for disinfecting solutions.    CC/Reason for Visit: Establish care, discuss chronic conditions Previous PCP: Parks Ranger in [facility] Last Visit: 2020  HPI: Bill [name] is a 54 y.o. male who is coming in today for the above mentioned reasons. Past Medical History is significant for: Uncontrolled hypertension currently not on any medications, hyperlipidemia, history of gout, history of chronic right hip and knee pain.  He had a right hip fracture and required ORIF.  He lives in [place], he is single, he is a Engineer, manufacturing systems.  He does not smoke, drinks alcohol occasionally, he has no known drug allergies, his past surgical history significant for right hip ORIF.  Family history significant for a mother who has duodenal cancer and a father who has hypertension diabetes.  He wonders if he might have sleep apnea as he snores a lot and is fatigued in the afternoon.  He wants to know what he can do about his chronic hip and knee pain as it interferes with his work.   Past Medical/Surgical History: Past Medical History:  Diagnosis Date  . Gout   . Hypertension     Past Surgical History:  Procedure Laterality Date  . DG OPERATIVE RIGHT HIP ([place] HX)      Social History:  reports that he has never smoked. He has never used smokeless tobacco. He reports that he does not drink alcohol or use drugs.  Allergies: No Known Allergies  Family History:  Family History  Problem Relation Age of Onset  . Diabetes Father      Current Outpatient Medications:  .  naproxen (NAPROSYN) 500 MG tablet, Take 1 tablet (500 mg total) by mouth 2 (two) times daily with a  meal. For 2-4 weeks then as needed, Disp: 60 tablet, Rfl: 2 .  hydrochlorothiazide (HYDRODIURIL) 25 MG tablet, Take 1 tablet (25 mg total) by mouth daily., Disp: 90 tablet, Rfl: 1 .  lisinopril (ZESTRIL) 10 MG tablet, Take 1 tablet (10 mg total) by mouth daily., Disp: 90 tablet, Rfl: 1  Review of Systems:  Constitutional: Denies fever, chills, diaphoresis, appetite change and fatigue.  HEENT: Denies photophobia, eye pain, redness, hearing loss, ear pain, congestion, sore throat, rhinorrhea, sneezing, mouth sores, trouble swallowing, neck pain, neck stiffness and tinnitus.   Respiratory: Denies SOB, DOE, cough, chest tightness,  and wheezing.   Cardiovascular: Denies chest pain, palpitations and leg swelling.  Gastrointestinal: Denies nausea, vomiting, abdominal pain, diarrhea, constipation, blood in stool and abdominal distention.  Genitourinary: Denies dysuria, urgency, frequency, hematuria, flank pain and difficulty urinating.  Endocrine: Denies: hot or cold intolerance, sweats, changes in hair or nails, polyuria, polydipsia. Musculoskeletal: Denies myalgias, back pain, joint swelling, arthralgias and gait problem.  Skin: Denies pallor, rash and wound.  Neurological: Denies dizziness, seizures, syncope, weakness, light-headedness, numbness and headaches.  Hematological: Denies adenopathy. Easy bruising, personal or family bleeding history  Psychiatric/Behavioral: Denies suicidal ideation, mood changes, confusion, nervousness, sleep disturbance and agitation    Physical Exam: Vitals:   12/16/19 0904  BP: (!) 170/110  Pulse: (!) 104  Temp: (!) 97.5 F (36.4 C)  TempSrc: Temporal  SpO2: (!) 74%  Weight: 256 lb 3.2 oz (116.2 kg)  Height:  5\' 11"  (1.803 m)   Body mass index is 35.73 kg/m.  Constitutional: NAD, calm, comfortable Eyes: PERRL, lids and conjunctivae normal ENMT: Mucous membranes are moist.  Respiratory: clear to auscultation bilaterally, no wheezing, no crackles. Normal  respiratory effort. No accessory muscle use.  Cardiovascular: Regular rate and rhythm, no murmurs / rubs / gallops. No extremity edema. Neurologic: Grossly intact and nonfocal  Psychiatric: Normal judgment and insight. Alert and oriented x 3. Normal mood.    Impression and Plan:  Essential hypertension  -Uncontrolled, start hydrochlorothiazide 25 mg and lisinopril 10 mg. -Return in 8 weeks for blood pressure check.  Screening for malignant neoplasm of colon  - Plan: Ambulatory referral to Gastroenterology  Sleep apnea, unspecified type suspected - Plan: Ambulatory referral to Pulmonology for sleep test  Chronic pain of right knee Right hip pain   - Plan: Ambulatory referral to Sports Medicine  Hyperlipidemia, unspecified hyperlipidemia type -Last LDL on file was 11/20/2015. -Check lipids when he returns for his physical.    Patient Instructions  -Nice seeing you today!!  -Start HCTZ 25 mg daily.  -START lisinopril 10 mg daily.  -Low salt diet (see below).  -Return in 8 weeks for your physical. Please come in fasting that day.   DASH Eating Plan DASH stands for "Dietary Approaches to Stop Hypertension." The DASH eating plan is a healthy eating plan that has been shown to reduce high blood pressure (hypertension). It may also reduce your risk for type 2 diabetes, heart disease, and stroke. The DASH eating plan may also help with weight loss. What are tips for following this plan?  General guidelines  Avoid eating more than 2,300 mg (milligrams) of salt (sodium) a day. If you have hypertension, you may need to reduce your sodium intake to 1,500 mg a day.  Limit alcohol intake to no more than 1 drink a day for nonpregnant women and 2 drinks a day for men. One drink equals 12 oz of beer, 5 oz of wine, or 1 oz of hard liquor.  Work with your health care provider to maintain a healthy body weight or to lose weight. Ask what an ideal weight is for you.  Get at least 30  minutes of exercise that causes your heart to beat faster (aerobic exercise) most days of the week. Activities may include walking, swimming, or biking.  Work with your health care provider or diet and nutrition specialist (dietitian) to adjust your eating plan to your individual calorie needs. Reading food labels   Check food labels for the amount of sodium per serving. Choose foods with less than 5 percent of the Daily Value of sodium. Generally, foods with less than 300 mg of sodium per serving fit into this eating plan.  To find whole grains, look for the word "whole" as the first word in the ingredient list. Shopping  Buy products labeled as "low-sodium" or "no salt added."  Buy fresh foods. Avoid canned foods and premade or frozen meals. Cooking  Avoid adding salt when cooking. Use salt-free seasonings or herbs instead of table salt or sea salt. Check with your health care provider or pharmacist before using salt substitutes.  Do not fry foods. Cook foods using healthy methods such as baking, boiling, grilling, and broiling instead.  Cook with heart-healthy oils, such as olive, canola, soybean, or sunflower oil. Meal planning  Eat a balanced diet that includes: ? 5 or more servings of fruits and vegetables each day. At each meal, try to fill  half of your plate with fruits and vegetables. ? Up to 6-8 servings of whole grains each day. ? Less than 6 oz of lean meat, poultry, or fish each day. A 3-oz serving of meat is about the same size as a deck of cards. One egg equals 1 oz. ? 2 servings of low-fat dairy each day. ? A serving of nuts, seeds, or beans 5 times each week. ? Heart-healthy fats. Healthy fats called Omega-3 fatty acids are found in foods such as flaxseeds and coldwater fish, like sardines, salmon, and mackerel.  Limit how much you eat of the following: ? Canned or prepackaged foods. ? Food that is high in trans fat, such as fried foods. ? Food that is high in  saturated fat, such as fatty meat. ? Sweets, desserts, sugary drinks, and other foods with added sugar. ? Full-fat dairy products.  Do not salt foods before eating.  Try to eat at least 2 vegetarian meals each week.  Eat more home-cooked food and less restaurant, buffet, and fast food.  When eating at a restaurant, ask that your food be prepared with less salt or no salt, if possible. What foods are recommended? The items listed may not be a complete list. Talk with your dietitian about what dietary choices are best for you. Grains Whole-grain or whole-wheat bread. Whole-grain or whole-wheat pasta. Brown rice. [place]. Bulgur. Whole-grain and low-sodium cereals. Pita bread. Low-fat, low-sodium crackers. Whole-wheat flour tortillas. Vegetables Fresh or frozen vegetables (raw, steamed, roasted, or grilled). Low-sodium or reduced-sodium tomato and vegetable juice. Low-sodium or reduced-sodium tomato sauce and tomato paste. Low-sodium or reduced-sodium canned vegetables. Fruits All fresh, dried, or frozen fruit. Canned fruit in natural juice (without added sugar). Meat and other protein foods Skinless chicken or [place]. Ground chicken or [place]. Pork with fat trimmed off. Fish and seafood. Egg whites. Dried beans, peas, or lentils. Unsalted nuts, nut butters, and seeds. Unsalted canned beans. Lean cuts of beef with fat trimmed off. Low-sodium, lean deli meat. Dairy Low-fat (1%) or fat-free (skim) milk. Fat-free, low-fat, or reduced-fat cheeses. Nonfat, low-sodium ricotta or cottage cheese. Low-fat or nonfat yogurt. Low-fat, low-sodium cheese. Fats and oils Soft margarine without trans fats. Vegetable oil. Low-fat, reduced-fat, or light mayonnaise and salad dressings (reduced-sodium). Canola, safflower, olive, soybean, and sunflower oils. Avocado. Seasoning and other foods Herbs. Spices. Seasoning mixes without salt. Unsalted popcorn and pretzels. Fat-free sweets. What foods are not  recommended? The items listed may not be a complete list. Talk with your dietitian about what dietary choices are best for you. Grains Baked goods made with fat, such as croissants, muffins, or some breads. Dry pasta or rice meal packs. Vegetables Creamed or fried vegetables. Vegetables in a cheese sauce. Regular canned vegetables (not low-sodium or reduced-sodium). Regular canned tomato sauce and paste (not low-sodium or reduced-sodium). Regular tomato and vegetable juice (not low-sodium or reduced-sodium). Angie Fava. Olives. Fruits Canned fruit in a light or heavy syrup. Fried fruit. Fruit in cream or butter sauce. Meat and other protein foods Fatty cuts of meat. Ribs. Fried meat. Berniece Salines. Sausage. Bologna and other processed lunch meats. Salami. Fatback. Hotdogs. Bratwurst. Salted nuts and seeds. Canned beans with added salt. Canned or smoked fish. Whole eggs or egg yolks. Chicken or [place] with skin. Dairy Whole or 2% milk, cream, and half-and-half. Whole or full-fat cream cheese. Whole-fat or sweetened yogurt. Full-fat cheese. Nondairy creamers. Whipped toppings. Processed cheese and cheese spreads. Fats and oils Butter. Stick margarine. Lard. Shortening. Ghee. Bacon fat. Tropical  oils, such as coconut, palm kernel, or palm oil. Seasoning and other foods Salted popcorn and pretzels. Onion salt, garlic salt, seasoned salt, table salt, and sea salt. Worcestershire sauce. Tartar sauce. Barbecue sauce. Teriyaki sauce. Soy sauce, including reduced-sodium. Steak sauce. Canned and packaged gravies. Fish sauce. Oyster sauce. Cocktail sauce. Horseradish that you find on the shelf. Ketchup. Mustard. Meat flavorings and tenderizers. Bouillon cubes. Hot sauce and Tabasco sauce. Premade or packaged marinades. Premade or packaged taco seasonings. Relishes. Regular salad dressings. Where to find more information:  National Heart, Lung, and [place]: https://wilson-eaton.com/  American Heart Association:  www.heart.org Summary  The DASH eating plan is a healthy eating plan that has been shown to reduce high blood pressure (hypertension). It may also reduce your risk for type 2 diabetes, heart disease, and stroke.  With the DASH eating plan, you should limit salt (sodium) intake to 2,300 mg a day. If you have hypertension, you may need to reduce your sodium intake to 1,500 mg a day.  When on the DASH eating plan, aim to eat more fresh fruits and vegetables, whole grains, lean proteins, low-fat dairy, and heart-healthy fats.  Work with your health care provider or diet and nutrition specialist (dietitian) to adjust your eating plan to your individual calorie needs. This information is not intended to replace advice given to you by your health care provider. Make sure you discuss any questions you have with your health care provider. Document Revised: 10/03/2017 Document Reviewed: 10/14/2016 Elsevier Patient Education  2020 [place], MD [facility]

## 2019-12-21 ENCOUNTER — Encounter: Payer: Self-pay | Admitting: Family Medicine

## 2019-12-21 ENCOUNTER — Other Ambulatory Visit: Payer: Self-pay | Admitting: Family Medicine

## 2019-12-21 ENCOUNTER — Ambulatory Visit (INDEPENDENT_AMBULATORY_CARE_PROVIDER_SITE_OTHER): Payer: BC Managed Care – PPO

## 2019-12-21 ENCOUNTER — Ambulatory Visit: Payer: Self-pay

## 2019-12-21 ENCOUNTER — Ambulatory Visit: Payer: BC Managed Care – PPO | Admitting: Family Medicine

## 2019-12-21 ENCOUNTER — Other Ambulatory Visit: Payer: Self-pay

## 2019-12-21 VITALS — BP 142/92 | HR 92 | Ht 71.0 in | Wt 253.0 lb

## 2019-12-21 DIAGNOSIS — G8929 Other chronic pain: Secondary | ICD-10-CM | POA: Diagnosis not present

## 2019-12-21 DIAGNOSIS — M25561 Pain in right knee: Secondary | ICD-10-CM

## 2019-12-21 DIAGNOSIS — M1651 Unilateral post-traumatic osteoarthritis, right hip: Secondary | ICD-10-CM | POA: Diagnosis not present

## 2019-12-21 DIAGNOSIS — M1A0720 Idiopathic chronic gout, left ankle and foot, without tophus (tophi): Secondary | ICD-10-CM | POA: Diagnosis not present

## 2019-12-21 MED ORDER — VITAMIN D (ERGOCALCIFEROL) 1.25 MG (50000 UNIT) PO CAPS: 50000.0000 [IU] | ORAL_CAPSULE | ORAL | 0 refills | Status: DC

## 2019-12-21 NOTE — Assessment & Plan Note (Signed)
I believe that gout is playing a role here.  Last uric acid was 8.6.  Is not on any treatment and is on hydrochlorothiazide.  We will continue the hydrochlorothiazide and discussed over-the-counter medications, but could be a candidate for allopurinol in the long run.  Follow-up with me again in 4 to 8 weeks

## 2019-12-21 NOTE — Progress Notes (Signed)
[place] Phone: [phone] Subjective:   Bill [name], am serving as a scribe for Dr. Hulan Saas. This visit occurred during the SARS-CoV-2 public health emergency.  Safety protocols were in place, including screening questions prior to the visit, additional usage of staff PPE, and extensive cleaning of exam room while observing appropriate contact time as indicated for disinfecting solutions.   I'm seeing this patient by the request  of:  Isaac Bliss, Rayford Halsted, MD  CC: Right hip and knee pain  [id number]  Bill [name] is a 54 y.o. male coming in with complaint of right hip acetabular fracture in the 90s. The more he moves the more his hip tightness up. Pain in anterior hip joint.   Patient having right knee pain with stair climbing. Pain got bad enough where he had to use crutches. Works for [employer] and does a lot of stairs patient feels like this seems to exacerbate it.  Patient states that it is more than diffuse around the knee.  Since then it seems to be somewhat anterior but can go posteriorly, and does not know if there is swelling involved or not.  Denies of any instability chest pain.  Has not tried any bracing but does respond to ice but he does have very minimal.     Past Medical History:  Diagnosis Date  . Gout   . Hypertension    Past Surgical History:  Procedure Laterality Date  . DG OPERATIVE RIGHT HIP ([place] HX)     Social History   Socioeconomic History  . Marital status: Single    Spouse name: Not on file  . Number of children: Not on file  . Years of education: Not on file  . Highest education level: Not on file  Occupational History  . 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 Smoking status: Never Smoker  . Smokeless tobacco: Never Used  Substance and Sexual Activity  . Alcohol use: [name]  . Drug use: [name]  . Sexual activity: Not on file  Other  Topics Concern  . Not on file  Social History Narrative  . Not on file   Social Determinants of Health   Financial Resource Strain:   . Difficulty of Paying Living Expenses: Not on file  Food Insecurity:   . Worried About Charity fundraiser in the Last Year: Not on file  . Ran Out of Food in the Last Year: Not on file  Transportation Needs:   . Lack of Transportation (Medical): Not on file  . Lack of Transportation (Non-Medical): Not on file  Physical Activity:   . Days of Exercise per Week: Not on file  . Minutes of Exercise per Session: Not on file  Stress:   . Feeling of Stress : Not on file  Social Connections:   . Frequency of Communication with Friends and Family: Not on file  . Frequency of Social Gatherings with Friends and Family: Not on file  . Attends Religious Services: Not on file  . Active Member of Clubs or Organizations: Not on file  . Attends Archivist Meetings: Not on file  . Marital Status: Not on file   [name] Known Allergies Family History  Problem Relation Age of Onset  . Diabetes Father      Current Outpatient Medications (Cardiovascular):  .  hydrochlorothiazide (HYDRODIURIL) 25 MG tablet, Take 1 tablet (25 mg total) by mouth  daily. .  lisinopril (ZESTRIL) 10 MG tablet, Take 1 tablet (10 mg total) by mouth daily.   Current Outpatient Medications (Analgesics):  .  naproxen (NAPROSYN) 500 MG tablet, Take 1 tablet (500 mg total) by mouth 2 (two) times daily with a meal. For 2-4 weeks then as needed   Current Outpatient Medications (Other):  Marland Kitchen  Vitamin D, Ergocalciferol, (DRISDOL) 1.25 MG (50000 UNIT) CAPS capsule, Take 1 capsule (50,000 Units total) by mouth every 7 (seven) days.   Reviewed prior external information including notes and imaging from  primary care provider As well as notes that were available from care everywhere and other healthcare systems.  Past medical history, social, surgical and family history all reviewed in  electronic medical record.  [name] pertanent information unless stated regarding to the chief complaint.   Review of Systems:  [name] headache, visual changes, nausea, vomiting, diarrhea, constipation, dizziness, abdominal pain, skin rash, fevers, chills, night sweats, weight loss, swollen lymph nodes, body aches, joint swelling, chest pain, shortness of breath, mood changes. POSITIVE muscle aches  Objective  Blood pressure (!) 142/92, pulse 92, height 5\' 11"  (1.803 m), weight 253 lb (114.8 kg), SpO2 97 %.   General: [name] apparent distress alert and oriented x3 mood and affect normal, dressed appropriately.  HEENT: Pupils equal, extraocular movements intact  Respiratory: Patient's speak in full sentences and does not appear short of breath  Cardiovascular: [name] lower extremity edema, non tender, [name] erythema  Skin: Warm dry intact with [name] signs of infection or rash on extremities or on axial skeleton.  Abdomen: Soft nontender  Neuro: Cranial nerves II through XII are intact, neurovascularly intact in all extremities with 2+ DTRs and 2+ pulses.  Lymph: [name] lymphadenopathy of posterior or anterior cervical chain or axillae bilaterally.  Gait normal with good balance and coordination.  MSK:   Right hip has [name] internal range of motion.  Tender to palpation in the groin area.  4+ out of 5 strength of the hip flexion compared to the contralateral side and 4 out of 5 strength of the hip abductors.  Right knee exam tender to palpation over the medial joint line, mild crepitus with patellofemoral testing but negative grind test noted.  [name] instability noted.  Limited musculoskeletal ultrasound was performed and interpreted by Lyndal Pulley  Limited ultrasound of patient's right knee shows the patient does have a trace effusion but does have an double line that is consistent with potential gout.  Meniscus appears to have some mild degenerative changes laterally but nondisplaced. Impression: Mild arthritic changes  trace effusion, likely gouty deposits    Impression and Recommendations:     This case required medical decision making of moderate complexity. The above documentation has been reviewed and is accurate and complete Lyndal Pulley, DO       Note: This dictation was prepared with Dragon dictation along with smaller phrase technology. Any transcriptional errors that result from this process are unintentional.

## 2019-12-21 NOTE — Assessment & Plan Note (Signed)
I believe the patient is likely having worsening symptoms on the right hip.  Patient does likely have arthritic changes and I do feel a repeat x-ray of the hip as well given.  Discussed which activities to do which wants to avoid.  This could be contributing to some of the knee pain but I do believe that patient's knee appears to be more of a chronic gout issue.  We discussed the change in hydrochlorothiazide but we will ask primary care provider.  Worsening symptoms will consider the possibility of injections.  Follow-up with me again in 4 weeks

## 2019-12-21 NOTE — Patient Instructions (Addendum)
Right knee and hip Ice 20 mins after activity Voltaren gel over the counter Knee exercises 3 times a week Tart cherry extract 1200mg  at night Once weekly vitamin D See me again in 4-8 weeks

## 2019-12-29 ENCOUNTER — Other Ambulatory Visit: Payer: Self-pay

## 2019-12-29 ENCOUNTER — Ambulatory Visit (AMBULATORY_SURGERY_CENTER): Payer: Self-pay | Admitting: *Deleted

## 2019-12-29 VITALS — Temp 96.1°F | Ht 71.0 in | Wt 255.2 lb

## 2019-12-29 DIAGNOSIS — Z1211 Encounter for screening for malignant neoplasm of colon: Secondary | ICD-10-CM

## 2019-12-29 DIAGNOSIS — Z01818 Encounter for other preprocedural examination: Secondary | ICD-10-CM

## 2019-12-29 MED ORDER — CLENPIQ 10-3.5-12 MG-GM -GM/160ML PO SOLN: 1.0000 | Freq: Once | ORAL | 0 refills | Status: AC

## 2019-12-29 NOTE — Progress Notes (Signed)
Pt is aware that care partner will wait in the car during procedure; if they feel like they will be too hot or cold to wait in the car; they may wait in the 4 th floor lobby. Patient is aware to bring only one care partner. We want them to wear a mask (we do not have any that we can provide them), practice social distancing, and we will check their temperatures when they get here.  I did remind the patient that their care partner needs to stay in the parking lot the entire time and have a cell phone available, we will call them when the pt is ready for discharge. Patient will wear mask into building.  covid test 01-07-20 at 9:40  Clenpiq coupon given   No egg or soy allergy  No home oxygen use   No medications for weight loss taken  emmi information given   No trouble with anesthesia, difficulty with intubation or hx/fam hx of malignant hyperthermia per pt

## 2019-12-30 ENCOUNTER — Institutional Professional Consult (permissible substitution): Payer: BC Managed Care – PPO | Admitting: Pulmonary Disease

## 2020-01-10 ENCOUNTER — Other Ambulatory Visit: Payer: Self-pay | Admitting: Gastroenterology

## 2020-01-10 ENCOUNTER — Other Ambulatory Visit: Payer: Self-pay

## 2020-01-10 ENCOUNTER — Ambulatory Visit (INDEPENDENT_AMBULATORY_CARE_PROVIDER_SITE_OTHER): Payer: BC Managed Care – PPO

## 2020-01-10 DIAGNOSIS — Z1159 Encounter for screening for other viral diseases: Secondary | ICD-10-CM

## 2020-01-11 LAB — SARS CORONAVIRUS 2 (TAT 6-24 HRS): SARS Coronavirus 2: NEGATIVE

## 2020-01-12 ENCOUNTER — Ambulatory Visit (AMBULATORY_SURGERY_CENTER): Payer: BC Managed Care – PPO | Admitting: Gastroenterology

## 2020-01-12 ENCOUNTER — Other Ambulatory Visit: Payer: Self-pay

## 2020-01-12 ENCOUNTER — Encounter: Payer: BC Managed Care – PPO | Admitting: Gastroenterology

## 2020-01-12 ENCOUNTER — Encounter: Payer: Self-pay | Admitting: Gastroenterology

## 2020-01-12 VITALS — BP 120/86 | HR 75 | Temp 96.6°F | Resp 17 | Ht 71.0 in | Wt 255.0 lb

## 2020-01-12 DIAGNOSIS — K635 Polyp of colon: Secondary | ICD-10-CM

## 2020-01-12 DIAGNOSIS — D125 Benign neoplasm of sigmoid colon: Secondary | ICD-10-CM

## 2020-01-12 DIAGNOSIS — Z1211 Encounter for screening for malignant neoplasm of colon: Secondary | ICD-10-CM

## 2020-01-12 DIAGNOSIS — D120 Benign neoplasm of cecum: Secondary | ICD-10-CM

## 2020-01-12 MED ORDER — SODIUM CHLORIDE 0.9 % IV SOLN: 500.0000 mL | Freq: Once | INTRAVENOUS | Status: DC

## 2020-01-12 NOTE — Progress Notes (Signed)
Called to room to assist during endoscopic procedure.  Patient ID and intended procedure confirmed with present staff. Received instructions for my participation in the procedure from the performing physician.  

## 2020-01-12 NOTE — Progress Notes (Signed)
Pt's states no medical or surgical changes since previsit or office visit.  VS by KA. Temp by LC. 

## 2020-01-12 NOTE — Op Note (Signed)
[facility] Patient Name: Bill [name] Procedure Date: 01/12/2020 12:31 PM MRN: [id number] Endoscopist: Remo Lipps P. Bill [name] , MD Age: 54 Referring MD:  Date of Math: October 28, 1966 Gender: Male Account #: [id number] Procedure:                Colonoscopy Indications:              Screening for colorectal malignant neoplasm, This                            is the patient's first colonoscopy Medicines:                Monitored Anesthesia Care Procedure:                Pre-Anesthesia Assessment:                           - Prior to the procedure, a History and Physical                            was performed, and patient medications and                            allergies were reviewed. The patient's tolerance of                            previous anesthesia was also reviewed. The risks                            and benefits of the procedure and the sedation                            options and risks were discussed with the patient.                            All questions were answered, and informed consent                            was obtained. Prior Anticoagulants: The patient has                            taken no previous anticoagulant or antiplatelet                            agents. ASA Grade Assessment: II - A patient with                            mild systemic disease. After reviewing the risks                            and benefits, the patient was deemed in                            satisfactory condition to undergo the procedure.  After obtaining informed consent, the colonoscope                            was passed under direct vision. Throughout the                            procedure, the patient's blood pressure, pulse, and                            oxygen saturations were monitored continuously. The                            Colonoscope was introduced through the anus and                            advanced to the the  cecum, identified by                            appendiceal orifice and ileocecal valve. The                            colonoscopy was performed without difficulty. The                            patient tolerated the procedure well. The quality                            of the bowel preparation was good. The ileocecal                            valve, appendiceal orifice, and rectum were                            photographed. Scope In: 12:36:14 PM Scope Out: 12:55:15 PM Scope Withdrawal Time: 0 hours 16 minutes 20 seconds  Total Procedure Duration: 0 hours 19 minutes 1 second  Findings:                 The perianal and digital rectal examinations were                            normal.                           A diminutive polyp was found in the cecum. The                            polyp was sessile. The polyp was removed with a                            cold snare. Resection and retrieval were complete.                           A 3 mm polyp was found in the recto-sigmoid colon.  The polyp was sessile. The polyp was removed with a                            cold snare. Resection and retrieval were complete.                           A few medium-mouthed diverticula were found in the                            sigmoid colon, ascending colon and cecum.                           Internal hemorrhoids were found during retroflexion.                           The exam was otherwise without abnormality. Complications:            No immediate complications. Estimated blood loss:                            Minimal. Estimated Blood Loss:     Estimated blood loss was minimal. Impression:               - One diminutive polyp in the cecum, removed with a                            cold snare. Resected and retrieved.                           - One 3 mm polyp at the recto-sigmoid colon,                            removed with a cold snare. Resected and retrieved.                            - Diverticulosis in the sigmoid colon, in the                            ascending colon and in the cecum.                           - Internal hemorrhoids.                           - The examination was otherwise normal. Recommendation:           - Patient has a contact number available for                            emergencies. The signs and symptoms of potential                            delayed complications were discussed with the  patient. Return to normal activities tomorrow.                            Written discharge instructions were provided to the                            patient.                           - Resume previous diet.                           - Continue present medications.                           - Await pathology results. Remo Lipps P. Bill Malloy, MD 01/12/2020 12:58:29 PM This report has been signed electronically.

## 2020-01-12 NOTE — Patient Instructions (Signed)
Handouts on polyps, hemorrhoids, and diverticulosis given to you today  Await pathology results    YOU HAD AN ENDOSCOPIC PROCEDURE TODAY AT THE [facility]:   Refer to the procedure report that was given to you for any specific questions about what was found during the examination.  If the procedure report does not answer your questions, please call your gastroenterologist to clarify.  If you requested that your care partner not be given the details of your procedure findings, then the procedure report has been included in a sealed envelope for you to review at your convenience later.  YOU SHOULD EXPECT: Some feelings of bloating in the abdomen. Passage of more gas than usual.  Walking can help get rid of the air that was put into your GI tract during the procedure and reduce the bloating. If you had a lower endoscopy (such as a colonoscopy or flexible sigmoidoscopy) you may notice spotting of blood in your stool or on the toilet paper. If you underwent a bowel prep for your procedure, you may not have a normal bowel movement for a few days.  Please Note:  You might notice some irritation and congestion in your nose or some drainage.  This is from the oxygen used during your procedure.  There is no need for concern and it should clear up in a day or so.  SYMPTOMS TO REPORT IMMEDIATELY:   Following lower endoscopy (colonoscopy or flexible sigmoidoscopy):  Excessive amounts of blood in the stool  Significant tenderness or worsening of abdominal pains  Swelling of the abdomen that is new, acute  Fever of 100F or higher  For urgent or emergent issues, a gastroenterologist can be reached at any hour by calling [phone]. Do not use MyChart messaging for urgent concerns.    DIET:  We do recommend a small meal at first, but then you may proceed to your regular diet.  Drink plenty of fluids but you should avoid alcoholic beverages for 24 hours.  ACTIVITY:  You should plan to take  it easy for the rest of today and you should NOT DRIVE or use heavy machinery until tomorrow (because of the sedation medicines used during the test).    FOLLOW UP: Our staff will call the number listed on your records 48-72 hours following your procedure to check on you and address any questions or concerns that you may have regarding the information given to you following your procedure. If we do not reach you, we will leave a message.  We will attempt to reach you two times.  During this call, we will ask if you have developed any symptoms of COVID 19. If you develop any symptoms (ie: fever, flu-like symptoms, shortness of breath, cough etc.) before then, please call [phone].  If you test positive for Covid 19 in the 2 weeks post procedure, please call and report this information to us.    If any biopsies were taken you will be contacted by phone or by letter within the next 1-3 weeks.  Please call us at [phone] if you have not heard about the biopsies in 3 weeks.    SIGNATURES/CONFIDENTIALITY: You and/or your care partner have signed paperwork which will be entered into your electronic medical record.  These signatures attest to the fact that that the information above on your After Visit Summary has been reviewed and is understood.  Full responsibility of the confidentiality of this discharge information lies with you and/or your care-partner. 

## 2020-01-14 ENCOUNTER — Telehealth: Payer: Self-pay | Admitting: *Deleted

## 2020-01-14 NOTE — Telephone Encounter (Signed)
  Follow up Call-  Call back number 01/12/2020  Post procedure Call Back phone  # [phone]  Permission to leave phone message Yes  Some recent data might be hidden     Patient questions:  Message left to call us if necessary.

## 2020-01-18 ENCOUNTER — Ambulatory Visit: Payer: BC Managed Care – PPO | Admitting: Pulmonary Disease

## 2020-01-18 ENCOUNTER — Encounter: Payer: Self-pay | Admitting: Pulmonary Disease

## 2020-01-18 ENCOUNTER — Other Ambulatory Visit: Payer: Self-pay

## 2020-01-18 VITALS — BP 144/82 | HR 78 | Temp 97.1°F | Ht 71.0 in | Wt 255.0 lb

## 2020-01-18 DIAGNOSIS — G4733 Obstructive sleep apnea (adult) (pediatric): Secondary | ICD-10-CM | POA: Diagnosis not present

## 2020-01-18 NOTE — Progress Notes (Signed)
Subjective:    Patient ID: Bill [name], male    DOB: [birth year], 54 y.o.   MRN: [id number]  Patient is being seen for snoring, witnessed apneas  Usually goes to bed between 11 and 12 Takes him about 1 hour or less to fall asleep Wakes up about once or twice during the night Final wake up time about 7 AM  Sleep is mostly restorative Denies gasping respirations at night denies night sweats Denies morning headaches Denies dryness of his mouth in the mornings  Memory is good  No family history of obstructive sleep apnea  Never smoker  Does not usually take daytime naps  Past Medical History:  Diagnosis Date  . Arthritis    left leg  . Gout   . Hypertension    Social History   Socioeconomic History  . Marital status: Single    Spouse name: Not on file  . Number of children: Not on file  . Years of education: Not on file  . Highest education level: Not on file  Occupational History  . 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 Smoking status: Never Smoker  . Smokeless tobacco: Never Used  Substance and Sexual Activity  . Alcohol use: No  . Drug use: No  . Sexual activity: Not on file  Other Topics Concern  . Not on file  Social History Narrative  . Not on file   Social Determinants of Health   Financial Resource Strain:   . Difficulty of Paying Living Expenses:   Food Insecurity:   . Worried About Charity fundraiser in the Last Year:   . Arboriculturist in the Last Year:   Transportation Needs:   . Film/video editor (Medical):   Marland Kitchen Lack of Transportation (Non-Medical):   Physical Activity:   . Days of Exercise per Week:   . Minutes of Exercise per Session:   Stress:   . Feeling of Stress :   Social Connections:   . Frequency of Communication with Friends and Family:   . Frequency of Social Gatherings with Friends and Family:   . Attends Religious Services:   . Active Member of Clubs or Organizations:   . Attends Theatre manager Meetings:   Marland Kitchen Marital Status:   Intimate Partner Violence:   . Fear of Current or Ex-Partner:   . Emotionally Abused:   Marland Kitchen Physically Abused:   . Sexually Abused:    Family History  Problem Relation Age of Onset  . Diabetes Father   . Colon cancer Neg Hx   . Esophageal cancer Neg Hx   . Rectal cancer Neg Hx   . Stomach cancer Neg Hx      Review of Systems  Constitutional: Negative for fever and unexpected weight change.  HENT: Negative for congestion, dental problem, ear pain, nosebleeds, postnasal drip, rhinorrhea, sinus pressure, sneezing, sore throat and trouble swallowing.   Eyes: Negative for redness and itching.  Respiratory: Negative for cough, chest tightness, shortness of breath and wheezing.   Cardiovascular: Negative for palpitations and leg swelling.  Gastrointestinal: Negative for nausea and vomiting.  Genitourinary: Negative for dysuria.  Musculoskeletal: Positive for joint swelling.  Skin: Negative for rash.  Allergic/Immunologic: Negative.  Negative for environmental allergies, food allergies and immunocompromised state.  Neurological: Negative for headaches.  Hematological: Does not bruise/bleed easily.  Psychiatric/Behavioral: Negative for dysphoric mood. The patient is not nervous/anxious.       Objective:   Physical  Exam Constitutional:      Appearance: He is obese.  HENT:     Nose: Nose normal.     Mouth/Throat:     Mouth: Mucous membranes are moist.     Comments: Crowded oropharynx no Mallampati 4 Eyes:     Extraocular Movements: Extraocular movements intact.     Pupils: Pupils are equal, round, and reactive to light.  Cardiovascular:     Rate and Rhythm: Normal rate and regular rhythm.     Pulses: Normal pulses.     Heart sounds: Normal heart sounds. No murmur. No friction rub.  Pulmonary:     Effort: Pulmonary effort is normal. No respiratory distress.     Breath sounds: Normal breath sounds. No stridor. No wheezing or rhonchi.    Musculoskeletal:        General: Normal range of motion.     Cervical back: Normal range of motion and neck supple. No rigidity or tenderness.  Skin:    General: Skin is warm.  Neurological:     General: No focal deficit present.     Mental Status: He is alert.    Vitals:   01/18/20 0925  BP: (!) 144/82  Pulse: 78  Temp: (!) 97.1 F (36.2 C)  SpO2: 98%   Results of the Epworth flowsheet 01/18/2020  Sitting and reading 2  Watching TV 2  Sitting, inactive in a public place (e.g. a theatre or a meeting) 2  As a passenger in a car for an hour without a break 1  Lying down to rest in the afternoon when circumstances permit 2  Sitting and talking to someone 0  Sitting quietly after a lunch without alcohol 3  In a car, while stopped for a few minutes in traffic 0  Total score 12      Assessment & Plan:  Moderate probability of significant obstructive sleep apnea  Morbid obesity  Excessive daytime sleepiness  Pathophysiology of sleep disordered breathing discussed with the patient Treatment options for sleep disordered breathing discussed with the patient  We will schedule the patient for home sleep study  CPAP treatment discussed  I will see him back in the office in about 3 months  Encouraged to work on weight loss efforts

## 2020-01-18 NOTE — Patient Instructions (Signed)
Moderate probability of significant obstructive sleep apnea  We'll schedule you for home sleep study  We'll follow-up with you in about 3 months  Call with any significant concerns  Encourage you to continue with weight loss efforts Sleep Apnea Sleep apnea is a condition in which breathing pauses or becomes shallow during sleep. Episodes of sleep apnea usually last 10 seconds or longer, and they may occur as many as 20 times an hour. Sleep apnea disrupts your sleep and keeps your body from getting the rest that it needs. This condition can increase your risk of certain health problems, including:  Heart attack.  Stroke.  Obesity.  Diabetes.  Heart failure.  Irregular heartbeat. What are the causes? There are three kinds of sleep apnea:  Obstructive sleep apnea. This kind is caused by a blocked or collapsed airway.  Central sleep apnea. This kind happens when the part of the brain that controls breathing does not send the correct signals to the muscles that control breathing.  Mixed sleep apnea. This is a combination of obstructive and central sleep apnea. The most common cause of this condition is a collapsed or blocked airway. An airway can collapse or become blocked if:  Your throat muscles are abnormally relaxed.  Your tongue and tonsils are larger than normal.  You are overweight.  Your airway is smaller than normal. What increases the risk? You are more likely to develop this condition if you:  Are overweight.  Smoke.  Have a smaller than normal airway.  Are elderly.  Are male.  Drink alcohol.  Take sedatives or tranquilizers.  Have a family history of sleep apnea. What are the signs or symptoms? Symptoms of this condition include:  Trouble staying asleep.  Daytime sleepiness and tiredness.  Irritability.  Loud snoring.  Morning headaches.  Trouble concentrating.  Forgetfulness.  Decreased interest in sex.  Unexplained  sleepiness.  Mood swings.  Personality changes.  Feelings of depression.  Waking up often during the night to urinate.  Dry mouth.  Sore throat. How is this diagnosed? This condition may be diagnosed with:  A medical history.  A physical exam.  A series of tests that are done while you are sleeping (sleep study). These tests are usually done in a sleep lab, but they may also be done at home. How is this treated? Treatment for this condition aims to restore normal breathing and to ease symptoms during sleep. It may involve managing health issues that can affect breathing, such as high blood pressure or obesity. Treatment may include:  Sleeping on your side.  Using a decongestant if you have nasal congestion.  Avoiding the use of depressants, including alcohol, sedatives, and narcotics.  Losing weight if you are overweight.  Making changes to your diet.  Quitting smoking.  Using a device to open your airway while you sleep, such as: ? An oral appliance. This is a custom-made mouthpiece that shifts your lower jaw forward. ? A continuous positive airway pressure (CPAP) device. This device blows air through a mask when you breathe out (exhale). ? A nasal expiratory positive airway pressure (EPAP) device. This device has valves that you put into each nostril. ? A bi-level positive airway pressure (BPAP) device. This device blows air through a mask when you breathe in (inhale) and breathe out (exhale).  Having surgery if other treatments do not work. During surgery, excess tissue is removed to create a wider airway. It is important to get treatment for sleep apnea. Without treatment, this  condition can lead to:  High blood pressure.  Coronary artery disease.  In men, an inability to achieve or maintain an erection (impotence).  Reduced thinking abilities. Follow these instructions at home: Lifestyle  Make any lifestyle changes that your health care provider  recommends.  Eat a healthy, well-balanced diet.  Take steps to lose weight if you are overweight.  Avoid using depressants, including alcohol, sedatives, and narcotics.  Do not use any products that contain nicotine or tobacco, such as cigarettes, e-cigarettes, and chewing tobacco. If you need help quitting, ask your health care provider. General instructions  Take over-the-counter and prescription medicines only as told by your health care provider.  If you were given a device to open your airway while you sleep, use it only as told by your health care provider.  If you are having surgery, make sure to tell your health care provider you have sleep apnea. You may need to bring your device with you.  Keep all follow-up visits as told by your health care provider. This is important. Contact a health care provider if:  The device that you received to open your airway during sleep is uncomfortable or does not seem to be working.  Your symptoms do not improve.  Your symptoms get worse. Get help right away if:  You develop: ? Chest pain. ? Shortness of breath. ? Discomfort in your back, arms, or stomach.  You have: ? Trouble speaking. ? Weakness on one side of your body. ? Drooping in your face. These symptoms may represent a serious problem that is an emergency. Do not wait to see if the symptoms will go away. Get medical help right away. Call your local emergency services (911 in the [place] Do not drive yourself to the hospital. Summary  Sleep apnea is a condition in which breathing pauses or becomes shallow during sleep.  The most common cause is a collapsed or blocked airway.  The goal of treatment is to restore normal breathing and to ease symptoms during sleep. This information is not intended to replace advice given to you by your health care provider. Make sure you discuss any questions you have with your health care provider. Document Revised: 04/07/2019 Document  Reviewed: 06/16/2018 Elsevier Patient Education  [place].

## 2020-02-08 ENCOUNTER — Ambulatory Visit: Payer: BC Managed Care – PPO

## 2020-02-08 ENCOUNTER — Other Ambulatory Visit: Payer: Self-pay

## 2020-02-08 DIAGNOSIS — G4733 Obstructive sleep apnea (adult) (pediatric): Secondary | ICD-10-CM

## 2020-02-15 ENCOUNTER — Ambulatory Visit: Payer: BC Managed Care – PPO | Admitting: Family Medicine

## 2020-02-15 ENCOUNTER — Encounter: Payer: Self-pay | Admitting: Family Medicine

## 2020-02-15 ENCOUNTER — Other Ambulatory Visit: Payer: Self-pay

## 2020-02-15 VITALS — BP 150/90 | HR 87 | Ht 71.0 in | Wt 253.0 lb

## 2020-02-15 DIAGNOSIS — M2550 Pain in unspecified joint: Secondary | ICD-10-CM | POA: Diagnosis not present

## 2020-02-15 DIAGNOSIS — M25551 Pain in right hip: Secondary | ICD-10-CM

## 2020-02-15 DIAGNOSIS — M1651 Unilateral post-traumatic osteoarthritis, right hip: Secondary | ICD-10-CM

## 2020-02-15 DIAGNOSIS — M25561 Pain in right knee: Secondary | ICD-10-CM | POA: Diagnosis not present

## 2020-02-15 DIAGNOSIS — M1A0720 Idiopathic chronic gout, left ankle and foot, without tophus (tophi): Secondary | ICD-10-CM | POA: Diagnosis not present

## 2020-02-15 DIAGNOSIS — G8929 Other chronic pain: Secondary | ICD-10-CM

## 2020-02-15 LAB — TSH: TSH: 2.65 u[IU]/mL (ref 0.35–4.50)

## 2020-02-15 LAB — COMPREHENSIVE METABOLIC PANEL
ALT: 26 U/L (ref 0–53)
AST: 50 U/L — ABNORMAL HIGH (ref 0–37)
Albumin: 4.3 g/dL (ref 3.5–5.2)
Alkaline Phosphatase: 84 U/L (ref 39–117)
BUN: 16 mg/dL (ref 6–23)
CO2: 24 mEq/L (ref 19–32)
Calcium: 9.8 mg/dL (ref 8.4–10.5)
Chloride: 99 mEq/L (ref 96–112)
Creatinine, Ser: 1.27 mg/dL (ref 0.40–1.50)
GFR: 71.64 mL/min (ref >60.00)
Glucose, Bld: 187 mg/dL — ABNORMAL HIGH (ref 70–99)
Potassium: 3.9 mEq/L (ref 3.5–5.1)
Sodium: 132 mEq/L — ABNORMAL LOW (ref 135–145)
Total Bilirubin: 1.6 mg/dL — ABNORMAL HIGH (ref 0.2–1.2)
Total Protein: 9 g/dL — ABNORMAL HIGH (ref 6.0–8.3)

## 2020-02-15 LAB — URIC ACID: Uric Acid, Serum: 9.2 mg/dL — ABNORMAL HIGH (ref 4.0–7.8)

## 2020-02-15 LAB — CBC WITH DIFFERENTIAL/PLATELET
Basophils Absolute: 0 10*3/uL (ref 0.0–0.1)
Basophils Relative: 0.5 % (ref 0.0–3.0)
Eosinophils Absolute: 0.2 10*3/uL (ref 0.0–0.7)
Eosinophils Relative: 2.5 % (ref 0.0–5.0)
HCT: 39.9 % (ref 39.0–52.0)
Hemoglobin: 13.8 g/dL (ref 13.0–17.0)
Lymphocytes Relative: 41.1 % (ref 12.0–46.0)
Lymphs Abs: 3.2 10*3/uL (ref 0.7–4.0)
MCHC: 34.6 g/dL (ref 30.0–36.0)
MCV: 93.2 fl (ref 78.0–100.0)
Monocytes Absolute: 0.4 10*3/uL (ref 0.1–1.0)
Monocytes Relative: 4.9 % (ref 3.0–12.0)
Neutro Abs: 4 10*3/uL (ref 1.4–7.7)
Neutrophils Relative %: 51 % (ref 43.0–77.0)
Platelets: 303 10*3/uL (ref 150.0–400.0)
RBC: 4.28 Mil/uL (ref 4.22–5.81)
RDW: 13.7 % (ref 11.5–15.5)
WBC: 7.9 10*3/uL (ref 4.0–10.5)

## 2020-02-15 LAB — CHOLESTEROL, TOTAL: Cholesterol: 262 mg/dL — ABNORMAL HIGH (ref 0–200)

## 2020-02-15 LAB — IBC PANEL
Iron: 75 ug/dL (ref 42–165)
Saturation Ratios: 20.7 % (ref 20.0–50.0)
Transferrin: 259 mg/dL (ref 212.0–360.0)

## 2020-02-15 LAB — VITAMIN D 25 HYDROXY (VIT D DEFICIENCY, FRACTURES): VITD: 27.2 ng/mL — ABNORMAL LOW (ref 30.00–100.00)

## 2020-02-15 LAB — FERRITIN: Ferritin: 302.1 ng/mL (ref 22.0–322.0)

## 2020-02-15 LAB — SEDIMENTATION RATE: Sed Rate: 94 mm/hr — ABNORMAL HIGH (ref 0–20)

## 2020-02-15 LAB — C-REACTIVE PROTEIN: CRP: 1.2 mg/dL (ref 0.5–20.0)

## 2020-02-15 MED ORDER — ALLOPURINOL 100 MG PO TABS: 100.0000 mg | ORAL_TABLET | Freq: Every day | ORAL | 0 refills | Status: DC

## 2020-02-15 NOTE — Assessment & Plan Note (Signed)
Injection given.  Concern some of the secondary to gout.  Plan we will get laboratory work-up and will be forwarded to patient's primary care provider.  We will discuss if it would be possible to change his hydrochlorothiazide if uric acid is still elevated.  Started on a very low dose of allopurinol that I think will be beneficial.  Discussed icing regimen could be a candidate for viscosupplementation and follow-up with me again in 6 to 8 weeks

## 2020-02-15 NOTE — Assessment & Plan Note (Signed)
Chronic problem started on new medication of allopurinol warned potential side effects

## 2020-02-15 NOTE — Patient Instructions (Addendum)
Allopurinol 100 mg daily  Will talk to primary care Labs today See me again in 6-8 weeks

## 2020-02-15 NOTE — Progress Notes (Signed)
[place] [address] [place] Phone: [phone] Subjective:   I Bill [name] am serving as a Education administrator for Dr. Hulan Saas.  This visit occurred during the SARS-CoV-2 public health emergency.  Safety protocols were in place, including screening questions prior to the visit, additional usage of staff PPE, and extensive cleaning of exam room while observing appropriate contact time as indicated for disinfecting solutions.   I'm seeing this patient by the request  of:  Isaac Bliss, Rayford Halsted, MD  CC: Hip pain and knee pain follow-up  [id number]   12/21/2019 I believe that gout is playing a role here.  Last uric acid was 8.6.  Is not on any treatment and is on hydrochlorothiazide.  We will continue the hydrochlorothiazide and discussed over-the-counter medications, but could be a candidate for allopurinol in the long run.  Follow-up with me again in 4 to 8 weeks  I believe the patient is likely having worsening symptoms on the right hip.  Patient does likely have arthritic changes and I do feel a repeat x-ray of the hip as well given.  Discussed which activities to do which wants to avoid.  This could be contributing to some of the knee pain but I do believe that patient's knee appears to be more of a chronic gout issue.  We discussed the change in hydrochlorothiazide but we will ask primary care provider.  Worsening symptoms will consider the possibility of injections.  Follow-up with me again in 4 weeks  Update 02/15/2020 Bill [name] is a 54 y.o. male coming in with complaint of right knee and hip pain. Patient states he feels the same.  Patient had last exam did have some x-rays of the hip and knee done.  Knee showed mild to moderate arthritic changes.  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  Seems to be posttraumatic with potential small erosions that are secondary to gout.  Patient states  that the hip seems to be worse than the knee but the knee unfortunately contributes as well.      Past Medical History:  Diagnosis Date  . Arthritis    left leg  . Gout   . Hypertension    Past Surgical History:  Procedure Laterality Date  . DG OPERATIVE RIGHT HIP ([place] HX)     Social History   Socioeconomic History  . Marital status: Single    Spouse name: Not on file  . Number of children: Not on file  . Years of education: Not on file  . Highest education level: Not on file  Occupational History  . 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 Smoking status: Never Smoker  . Smokeless tobacco: Never Used  Substance and Sexual Activity  . Alcohol use: No  . Drug use: No  . Sexual activity: Not on file  Other Topics Concern  . Not on file  Social History Narrative  . Not on file   Social Determinants of Health   Financial Resource Strain:   . Difficulty of Paying Living Expenses:   Food Insecurity:   . Worried About Charity fundraiser in the Last Year:   . Arboriculturist in the Last Year:   Transportation Needs:   . Film/video editor (Medical):   Marland Kitchen Lack of Transportation (Non-Medical):   Physical Activity:   . Days of Exercise per Week:   . Minutes of Exercise per  Session:   Stress:   . Feeling of Stress :   Social Connections:   . Frequency of Communication with Friends and Family:   . Frequency of Social Gatherings with Friends and Family:   . Attends Religious Services:   . Active Member of Clubs or Organizations:   . Attends Archivist Meetings:   Marland Kitchen Marital Status:    Allergies  Allergen Reactions  . Dog Epithelium     Dog hair and dander   Family History  Problem Relation Age of Onset  . Diabetes Father   . Colon cancer Neg Hx   . Esophageal cancer Neg Hx   . Rectal cancer Neg Hx   . Stomach cancer Neg Hx      Current Outpatient Medications (Cardiovascular):  .  hydrochlorothiazide (HYDRODIURIL) 25  MG tablet, Take 1 tablet (25 mg total) by mouth daily. Marland Kitchen  lisinopril (ZESTRIL) 10 MG tablet, Take 1 tablet (10 mg total) by mouth daily.   Current Outpatient Medications (Analgesics):  .  allopurinol (ZYLOPRIM) 100 MG tablet, Take 1 tablet (100 mg total) by mouth daily.   Current Outpatient Medications (Other):  Marland Kitchen  Vitamin D, Ergocalciferol, (DRISDOL) 1.25 MG (50000 UNIT) CAPS capsule, Take 1 capsule (50,000 Units total) by mouth every 7 (seven) days.   Reviewed prior external information including notes and imaging from  primary care provider As well as notes that were available from care everywhere and other healthcare systems.  Past medical history, social, surgical and family history all reviewed in electronic medical record.  No pertanent information unless stated regarding to the chief complaint.   Review of Systems:  No headache, visual changes, nausea, vomiting, diarrhea, constipation, dizziness, abdominal pain, skin rash, fevers, chills, night sweats, weight loss, swollen lymph nodes,, chest pain, shortness of breath, mood changes. POSITIVE muscle aches, body aches, joint swelling  Objective  Blood pressure (!) 150/90, pulse 87, height 5\' 11"  (1.803 m), weight 253 lb (114.8 kg), SpO2 96 %.   General: No apparent distress alert and oriented x3 mood and affect normal, dressed appropriately.  HEENT: Pupils equal, extraocular movements intact  Respiratory: Patient's speak in full sentences and does not appear short of breath  Cardiovascular: No lower extremity edema, non tender, no erythema  Neuro: Cranial nerves II through XII are intact, neurovascularly intact in all extremities with 2+ DTRs and 2+ pulses.  Gait antalgic Patient's right hip has less than 5 degrees of internal rotation.  Lacks last 10 degrees of external rotation as well.  Mild atrophy of the thigh noted.  Knee exam does have some moderate arthritic changes but no significant instability.  Tender to palpation  over the medial lateral and patellofemoral joint.  After informed written and verbal consent, patient was seated on exam table. Right knee was prepped with alcohol swab and utilizing anterolateral approach, patient's right knee space was injected with 4:1  marcaine 0.5%: Kenalog 40mg /dL. Patient tolerated the procedure well without immediate complications.    Impression and Recommendations:     This case required medical decision making of moderate complexity. The above documentation has been reviewed and is accurate and complete Lyndal Pulley, DO       Note: This dictation was prepared with Dragon dictation along with smaller phrase technology. Any transcriptional errors that result from this process are unintentional.

## 2020-02-15 NOTE — Assessment & Plan Note (Signed)
Patient has continued to have significant difficulty and has been 2 years since initial diagnosis.  Almost 3 years actually.  At this point will refer to orthopedic surgery to discuss the possibility of surgical intervention

## 2020-02-16 LAB — ANA: Anti Nuclear Antibody (ANA): NEGATIVE

## 2020-02-16 LAB — CALCIUM, IONIZED: Calcium, Ion: 5.1 mg/dL (ref 4.8–5.6)

## 2020-02-16 LAB — PTH, INTACT AND CALCIUM
Calcium: 9.8 mg/dL (ref 8.6–10.3)
PTH: 41 pg/mL (ref 14–64)

## 2020-02-16 LAB — CYCLIC CITRUL PEPTIDE ANTIBODY, IGG: Cyclic Citrullin Peptide Ab: <16 UNITS

## 2020-02-16 LAB — ANGIOTENSIN CONVERTING ENZYME: Angiotensin-Converting Enzyme: 6 U/L — ABNORMAL LOW (ref 9–67)

## 2020-02-16 LAB — RHEUMATOID FACTOR: Rheumatoid fact SerPl-aCnc: <14 IU/mL (ref <14)

## 2020-02-17 DIAGNOSIS — G4733 Obstructive sleep apnea (adult) (pediatric): Secondary | ICD-10-CM

## 2020-02-18 ENCOUNTER — Telehealth: Payer: Self-pay | Admitting: Pulmonary Disease

## 2020-02-18 DIAGNOSIS — G4733 Obstructive sleep apnea (adult) (pediatric): Secondary | ICD-10-CM

## 2020-02-18 NOTE — Telephone Encounter (Signed)
ATC patient.  LM for Patient to call back for sleep results.   Dr. Ander Slade has reviewed the home sleep test this showed moderate obstructive sleep apnea.   Recommendations   Treatment options are CPAP with the settings auto 5 to 15.    Weight loss measures .   Advise against driving while sleepy & against medication with sedative side effects.    Make appointment for 3 months for compliance with download with Dr. Ander Slade.

## 2020-02-21 ENCOUNTER — Telehealth: Payer: Self-pay | Admitting: Internal Medicine

## 2020-02-21 NOTE — Telephone Encounter (Signed)
Pt states he received a call last week from Dr. Jerilee Hoh office but did not leave a name or what the call is about. I inform pt that I do not see any notes but will send a note back just incase it was the CMA.   Pt can be reached at [phone]

## 2020-02-21 NOTE — Telephone Encounter (Signed)
Patient is returning phone call. Patient phone number is [phone].

## 2020-02-21 NOTE — Telephone Encounter (Signed)
Called spoke with patient regarding results Patient voiced understanding Order placed Follow up scheduled for 2 months with TP  Nothing further needed at this time

## 2020-02-22 NOTE — Telephone Encounter (Signed)
Left detailed message on machine for patient to schedule an appointment to evaluate possible gout.

## 2020-02-24 NOTE — Telephone Encounter (Signed)
Spoke with patient and an appointment scheduled 

## 2020-02-29 ENCOUNTER — Other Ambulatory Visit: Payer: Self-pay | Admitting: Pulmonary Disease

## 2020-02-29 DIAGNOSIS — G4733 Obstructive sleep apnea (adult) (pediatric): Secondary | ICD-10-CM

## 2020-03-03 ENCOUNTER — Ambulatory Visit: Payer: Self-pay | Admitting: Internal Medicine

## 2020-03-06 ENCOUNTER — Other Ambulatory Visit: Payer: Self-pay

## 2020-03-07 ENCOUNTER — Encounter: Payer: Self-pay | Admitting: Internal Medicine

## 2020-03-07 ENCOUNTER — Ambulatory Visit (INDEPENDENT_AMBULATORY_CARE_PROVIDER_SITE_OTHER): Payer: BC Managed Care – PPO | Admitting: Internal Medicine

## 2020-03-07 VITALS — BP 110/80 | HR 89 | Temp 97.8°F | Wt 254.8 lb

## 2020-03-07 DIAGNOSIS — K219 Gastro-esophageal reflux disease without esophagitis: Secondary | ICD-10-CM

## 2020-03-07 DIAGNOSIS — I10 Essential (primary) hypertension: Secondary | ICD-10-CM

## 2020-03-07 DIAGNOSIS — M25551 Pain in right hip: Secondary | ICD-10-CM

## 2020-03-07 DIAGNOSIS — M1A0720 Idiopathic chronic gout, left ankle and foot, without tophus (tophi): Secondary | ICD-10-CM | POA: Diagnosis not present

## 2020-03-07 MED ORDER — PANTOPRAZOLE SODIUM 40 MG PO TBEC: 40.0000 mg | DELAYED_RELEASE_TABLET | Freq: Every day | ORAL | 1 refills | Status: DC

## 2020-03-07 MED ORDER — LISINOPRIL 20 MG PO TABS: 20.0000 mg | ORAL_TABLET | Freq: Every day | ORAL | 1 refills | Status: DC

## 2020-03-07 NOTE — Progress Notes (Signed)
Established Patient Office Visit     This visit occurred during the SARS-CoV-2 public health emergency.  Safety protocols were in place, including screening questions prior to the visit, additional usage of staff PPE, and extensive cleaning of exam room while observing appropriate contact time as indicated for disinfecting solutions.    CC/Reason for Visit: Follow-up blood pressure, gout  HPI: Bill [name] is a 54 y.o. male who is coming in today for the above mentioned reasons. Past Medical History is significant for: Hypertension, hyperlipidemia, gout and chronic right hip and knee pain.  At last visit in February he had markedly elevated blood pressure and was started on lisinopril 10 and hydrochlorothiazide 25.  He has had increase in knee pain since then.  He saw Dr. Tamala Julian with sports medicine, who thought that his gout may be at play and recommended that he discuss hydrochlorothiazide with me today.  He also like to discuss some new symptoms that he thinks are acid reflux.  He has nausea especially when he on an empty stomach, burning in his upper chest, sour taste in his mouth and of hoarse voice at times.   Past Medical/Surgical History: Past Medical History:  Diagnosis Date  . Arthritis    left leg  . Gout   . Hypertension     Past Surgical History:  Procedure Laterality Date  . DG OPERATIVE RIGHT HIP ([place] HX)      Social History:  reports that he has never smoked. He has never used smokeless tobacco. He reports that he does not drink alcohol or use drugs.  Allergies: Allergies  Allergen Reactions  . Dog Epithelium     Dog hair and dander    Family History:  Family History  Problem Relation Age of Onset  . Diabetes Father   . Colon cancer Neg Hx   . Esophageal cancer Neg Hx   . Rectal cancer Neg Hx   . Stomach cancer Neg Hx      Current Outpatient Medications:  .  allopurinol (ZYLOPRIM) 100 MG tablet, Take 1 tablet (100 mg total) by mouth daily.,  Disp: 30 tablet, Rfl: 0 .  lisinopril (ZESTRIL) 20 MG tablet, Take 1 tablet (20 mg total) by mouth daily., Disp: 90 tablet, Rfl: 1 .  pantoprazole (PROTONIX) 40 MG tablet, Take 1 tablet (40 mg total) by mouth daily., Disp: 90 tablet, Rfl: 1  Review of Systems:  Constitutional: Denies fever, chills, diaphoresis, appetite change and fatigue.  HEENT: Denies photophobia, eye pain, redness, hearing loss, ear pain, congestion, sore throat, rhinorrhea, sneezing, mouth sores, trouble swallowing, neck pain, neck stiffness and tinnitus.   Respiratory: Denies SOB, DOE, cough, chest tightness,  and wheezing.   Cardiovascular: Denies chest pain, palpitations and leg swelling.  Gastrointestinal: Denies  vomiting, abdominal pain, diarrhea, constipation, blood in stool and abdominal distention.  Genitourinary: Denies dysuria, urgency, frequency, hematuria, flank pain and difficulty urinating.  Endocrine: Denies: hot or cold intolerance, sweats, changes in hair or nails, polyuria, polydipsia. Musculoskeletal: Denies myalgias, back pain, joint swelling. Skin: Denies pallor, rash and wound.  Neurological: Denies dizziness, seizures, syncope, weakness, light-headedness, numbness and headaches.  Hematological: Denies adenopathy. Easy bruising, personal or family bleeding history  Psychiatric/Behavioral: Denies suicidal ideation, mood changes, confusion, nervousness, sleep disturbance and agitation    Physical Exam: Vitals:   03/07/20 1405  BP: 110/80  Pulse: 89  Temp: 97.8 F (36.6 C)  TempSrc: Temporal  SpO2: 97%  Weight: 254 lb 12.8 oz (115.6 kg)  Body mass index is 35.54 kg/m.   Constitutional: NAD, calm, comfortable Eyes: PERRL, lids and conjunctivae normal ENMT: Mucous membranes are moist.  Respiratory: clear to auscultation bilaterally, no wheezing, no crackles. Normal respiratory effort. No accessory muscle use.  Cardiovascular: Regular rate and rhythm, no murmurs / rubs / gallops. No  extremity edema.  Neurologic: Grossly intact and nonfocal Psychiatric: Normal judgment and insight. Alert and oriented x 3. Normal mood.    Impression and Plan:  Right hip pain Idiopathic chronic gout of left ankle without tophus Essential hypertension  -Since hydrochlorothiazide can exacerbate gout, will discontinue and increase lisinopril from 10 to 20 mg. -He will return in 8 weeks for follow-up. -He has a follow-up with orthopedics for his right hip as there is some talk about possibility of replacement soon.  Gastroesophageal reflux disease without esophagitis  -His symptoms certainly sound like dyspepsia. -12-week trial of PPI therapy, Protonix 40 mg daily.   Patient Instructions  -Nice seeing you today!!  -Start Protonix 40 mg daily for your acid reflux.  - Stop taking HCTZ.  -Increase lisinopril to 20 mg daily.  -Schedule follow up in 8 weeks for your blood pressure.     [name], MD [facility]

## 2020-03-07 NOTE — Patient Instructions (Signed)
-  Nice seeing you today!!  -Start Protonix 40 mg daily for your acid reflux.  - Stop taking HCTZ.  -Increase lisinopril to 20 mg daily.  -Schedule follow up in 8 weeks for your blood pressure.

## 2020-03-08 ENCOUNTER — Other Ambulatory Visit: Payer: Self-pay | Admitting: Family Medicine

## 2020-03-15 ENCOUNTER — Other Ambulatory Visit: Payer: Self-pay | Admitting: Family Medicine

## 2020-03-15 ENCOUNTER — Other Ambulatory Visit: Payer: Self-pay | Admitting: Orthopedic Surgery

## 2020-03-15 DIAGNOSIS — M25551 Pain in right hip: Secondary | ICD-10-CM

## 2020-03-30 ENCOUNTER — Ambulatory Visit
Admission: RE | Admit: 2020-03-30 | Discharge: 2020-03-30 | Disposition: A | Discharge status: Home / Self Care | Payer: BC Managed Care – PPO | Source: Ambulatory Visit | Attending: Orthopedic Surgery | Admitting: Orthopedic Surgery

## 2020-03-30 DIAGNOSIS — M25551 Pain in right hip: Secondary | ICD-10-CM

## 2020-04-03 NOTE — Progress Notes (Signed)
[name] Phone: [phone] Subjective:   Fontaine No, am serving as a scribe for Dr. Hulan Saas. This visit occurred during the SARS-CoV-2 public health emergency.  Safety protocols were in place, including screening questions prior to the visit, additional usage of staff PPE, and extensive cleaning of exam room while observing appropriate contact time as indicated for disinfecting solutions.   I'm seeing this patient by the request  of:  Isaac Bliss, Rayford Halsted, MD  CC: Right hip and knee pain follow-up  [id number]   02/15/2020 Injection given.  Concern some of the secondary to gout.  Plan we will get laboratory work-up and will be forwarded to patient's primary care provider.  We will discuss if it would be possible to change his hydrochlorothiazide if uric acid is still elevated.  Started on a very low dose of allopurinol that I think will be beneficial.  Discussed icing regimen could be a candidate for viscosupplementation and follow-up with me again in 6 to 8 weeks  Chronic problem started on new medication of allopurinol warned potential side effects  Update 04/04/2020 Malacki Venier Wageman is a 54 y.o. male coming in with complaint of right hip pain. Patient states that he has been having right knee pain since last visit. Pain with walking over anterior aspect. Steroid injection did help. Is also using allopurinol.  Patient states feeling better overall.  Patient states that the hip seems to have the morning difficult aspect still.  Was sent for a CT scan.  CT did show the patient severe arthritic changes as well as some erosive changes from gout.      Past Medical History:  Diagnosis Date  . Arthritis    left leg  . Gout   . Hypertension    Past Surgical History:  Procedure Laterality Date  . DG OPERATIVE RIGHT HIP ([place] HX)     Social History   Socioeconomic History  . Marital status: Single    Spouse name: Not on file  . Number of children: Not on file  . Years of education: Not on file  . Highest education level: Not on file  Occupational History  . 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 Smoking status: Never Smoker  . Smokeless tobacco: Never Used  Substance and Sexual Activity  . Alcohol use: No  . Drug use: No  . Sexual activity: Not on file  Other Topics Concern  . Not on file  Social History Narrative  . Not on file   Social Determinants of Health   Financial Resource Strain:   . Difficulty of Paying Living Expenses:   Food Insecurity:   . Worried About Charity fundraiser in the Last Year:   . Arboriculturist in the Last Year:   Transportation Needs:   . Film/video editor (Medical):   Marland Kitchen Lack of Transportation (Non-Medical):   Physical Activity:   . Days of Exercise per Week:   . Minutes of Exercise per Session:   Stress:   . Feeling of Stress :   Social Connections:   . Frequency of Communication with Friends and Family:   . Frequency of Social Gatherings with Friends and Family:   . Attends Religious Services:   . Active Member of Clubs or Organizations:   . Attends Archivist Meetings:   Marland Kitchen Marital Status:    Allergies  Allergen Reactions  . Dog  Epithelium     Dog hair and dander   Family History  Problem Relation Age of Onset  . Diabetes Father   . Colon cancer Neg Hx   . Esophageal cancer Neg Hx   . Rectal cancer Neg Hx   . Stomach cancer Neg Hx      Current Outpatient Medications (Cardiovascular):  .  lisinopril (ZESTRIL) 20 MG tablet, Take 1 tablet (20 mg total) by mouth daily.   Current Outpatient Medications (Analgesics):  .  allopurinol (ZYLOPRIM) 100 MG tablet, TAKE 1 TABLET BY MOUTH EVERY DAY   Current Outpatient Medications (Other):  .  pantoprazole (PROTONIX) 40 MG tablet, Take 1 tablet (40 mg total) by mouth daily. .  Vitamin D, Ergocalciferol, (DRISDOL) 1.25 MG (50000 UNIT) CAPS  capsule, TAKE 1 CAPSULE (50,000 UNITS TOTAL) BY MOUTH EVERY 7 (SEVEN) DAYS.   Reviewed prior external information including notes and imaging from  primary care provider As well as notes that were available from care everywhere and other healthcare systems.  Past medical history, social, surgical and family history all reviewed in electronic medical record.  No pertanent information unless stated regarding to the chief complaint.   Review of Systems:  No headache, visual changes, nausea, vomiting, diarrhea, constipation, dizziness, abdominal pain, skin rash, fevers, chills, night sweats, weight loss, swollen lymph nodes, body aches, joint swelling, chest pain, shortness of breath, mood changes. POSITIVE muscle aches  Objective  Blood pressure (!) 142/92, pulse 74, height 5\' 11"  (1.803 m), weight 248 lb (112.5 kg), SpO2 98 %.   General: No apparent distress alert and oriented x3 mood and affect normal, dressed appropriately.  HEENT: Pupils equal, extraocular movements intact  Respiratory: Patient's speak in full sentences and does not appear short of breath  Cardiovascular: No lower extremity edema, non tender, no erythema  Neuro: Cranial nerves II through XII are intact, neurovascularly intact in all extremities with 2+ DTRs and 2+ pulses.  Gait antalgic MSK: Patient's right hip has decreased internal range of motion to 0 degrees.  Patient also lacks 20 degrees of external rotation.  4-5 strength of the hip flexor noted. Right knee exam significant decrease in effusion from previous exam.  Lacks last 5 degrees of flexion.  Does have some crepitus of the patella with a positive grind test.   Impression and Recommendations:     The above documentation has been reviewed and is accurate and complete Lyndal Pulley, DO       Note: This dictation was prepared with Dragon dictation along with smaller phrase technology. Any transcriptional errors that result from this process are  unintentional.

## 2020-04-04 ENCOUNTER — Ambulatory Visit: Payer: BC Managed Care – PPO | Admitting: Family Medicine

## 2020-04-04 ENCOUNTER — Encounter: Payer: Self-pay | Admitting: Family Medicine

## 2020-04-04 ENCOUNTER — Other Ambulatory Visit: Payer: Self-pay

## 2020-04-04 DIAGNOSIS — M1A0720 Idiopathic chronic gout, left ankle and foot, without tophus (tophi): Secondary | ICD-10-CM | POA: Diagnosis not present

## 2020-04-04 DIAGNOSIS — M25561 Pain in right knee: Secondary | ICD-10-CM | POA: Diagnosis not present

## 2020-04-04 DIAGNOSIS — M1651 Unilateral post-traumatic osteoarthritis, right hip: Secondary | ICD-10-CM

## 2020-04-04 DIAGNOSIS — G8929 Other chronic pain: Secondary | ICD-10-CM | POA: Diagnosis not present

## 2020-04-04 NOTE — Patient Instructions (Signed)
Try colchicine daily for 7 days Continue all other medicines Get hip fixed and then re-evaluate knee See me when you need me

## 2020-04-04 NOTE — Assessment & Plan Note (Signed)
Gout overall.  Responded well to the injection.  We will see patient 6 weeks after the hip replacement

## 2020-04-04 NOTE — Assessment & Plan Note (Signed)
Patient is likely going to undergo a replacement in the near future.  CT scan shows that this is potentially a possibility.  Following up with the orthopedic surgeon in the near future.

## 2020-04-04 NOTE — Assessment & Plan Note (Signed)
Chronic problem.  Given trial of colchicine.  Patient's kidneys could not tolerated if necessary.  Discussed the over-the-counter medications that can be beneficial but patient has not been taking.  May need to increase allopurinol to 300.  Follow-up again after surgical intervention of the hip

## 2020-04-10 ENCOUNTER — Other Ambulatory Visit: Payer: Self-pay | Admitting: Family Medicine

## 2020-04-25 ENCOUNTER — Ambulatory Visit: Payer: BC Managed Care – PPO | Admitting: Adult Health

## 2020-05-05 ENCOUNTER — Other Ambulatory Visit: Payer: Self-pay | Admitting: Orthopedic Surgery

## 2020-05-11 NOTE — Telephone Encounter (Signed)
error 

## 2020-05-23 NOTE — Patient Instructions (Addendum)
DUE TO COVID-19 ONLY ONE VISITOR IS ALLOWED TO COME WITH YOU AND STAY IN THE WAITING ROOM ONLY DURING PRE OP AND PROCEDURE DAY OF SURGERY. THE 1 VISITOR MAY VISIT WITH YOU AFTER SURGERY IN YOUR PRIVATE ROOM DURING VISITING HOURS ONLY!  YOU NEED TO HAVE A COVID 19 TEST ON: 05/25/20 @ 3:00 pm , THIS TEST MUST BE DONE BEFORE SURGERY, COME  Bill [name] , [id number].  ([place]) ONCE YOUR COVID TEST IS COMPLETED, PLEASE BEGIN THE QUARANTINE INSTRUCTIONS AS OUTLINED IN YOUR HANDOUT.                Bill [name]   Your procedure is scheduled on: 05/29/20   Report to [facility] Main  Entrance   Report to short stay at: 5:30  AM     Call this number if you have problems the morning of surgery [phone]    Remember:   NO SOLID FOOD AFTER MIDNIGHT THE NIGHT PRIOR TO SURGERY. NOTHING BY MOUTH EXCEPT CLEAR LIQUIDS UNTIL: 4:15 am . PLEASE FINISH ENSURE DRINK PER SURGEON ORDER  WHICH NEEDS TO BE COMPLETED AT: 4:15 am .  CLEAR LIQUID DIET   Foods Allowed                                                                     Foods Excluded  Coffee and tea, regular and decaf                             liquids that you cannot  Plain Jell-O any favor except red or purple                                           see through such as: Fruit ices (not with fruit pulp)                                     milk, soups, orange juice  Iced Popsicles                                    All solid food Carbonated beverages, regular and diet                                    Cranberry, grape and apple juices Sports drinks like Gatorade Lightly seasoned clear broth or consume(fat free) Sugar, honey syrup  Sample Menu Breakfast                                Lunch                                     Supper Cranberry juice  Beef broth                            Chicken broth Jell-O                                     Grape juice                            Apple juice Coffee or tea                        Jell-O                                      Popsicle                                                Coffee or tea                        Coffee or tea  _____________________________________________________________________   BRUSH YOUR TEETH MORNING OF SURGERY AND RINSE YOUR MOUTH OUT, NO CHEWING GUM CANDY OR MINTS.     Take these medicines the morning of surgery with A SIP OF WATER: allopurinol,pantoprazole.                                You may not have any metal on your body including hair pins and              piercings  Do not wear jewelry, lotions, powders or perfumes, deodorant             Men may shave face and neck.   Do not bring valuables to the hospital. [place].  Contacts, dentures or bridgework may not be worn into surgery.  Leave suitcase in the car. After surgery it may be brought to your room.     Patients discharged the day of surgery will not be allowed to drive home. IF YOU ARE HAVING SURGERY AND GOING HOME THE SAME DAY, YOU MUST HAVE AN ADULT TO DRIVE YOU HOME AND BE WITH YOU FOR 24 HOURS. YOU MAY GO HOME BY TAXI OR UBER OR ORTHERWISE, BUT AN ADULT MUST ACCOMPANY YOU HOME AND STAY WITH YOU FOR 24 HOURS.  Name and phone number of your driver:  Special Instructions: N/A              Please read over the following fact sheets you were given: _____________________________________________________________________          [facility] - Preparing for Surgery Before surgery, you can play an important role.  Because skin is not sterile, your skin needs to be as free of germs as possible.  You can reduce the number of germs on your skin by washing with CHG (chlorahexidine gluconate) soap before surgery.  CHG is an antiseptic cleaner which kills germs and bonds with the skin to continue  killing germs even after washing. Please DO NOT use if you have an allergy to CHG or  antibacterial soaps.  If your skin becomes reddened/irritated stop using the CHG and inform your nurse when you arrive at Short Stay. Do not shave (including legs and underarms) for at least 48 hours prior to the first CHG shower.  You may shave your face/neck. Please follow these instructions carefully:  1.  Shower with CHG Soap the night before surgery and the  morning of Surgery.  2.  If you choose to wash your hair, wash your hair first as usual with your  normal  shampoo.  3.  After you shampoo, rinse your hair and body thoroughly to remove the  shampoo.                           4.  Use CHG as you would any other liquid soap.  You can apply chg directly  to the skin and wash                       Gently with a scrungie or clean washcloth.  5.  Apply the CHG Soap to your body ONLY FROM THE NECK DOWN.   Do not use on face/ open                           Wound or open sores. Avoid contact with eyes, ears mouth and genitals (private parts).                       Wash face,  Genitals (private parts) with your normal soap.             6.  Wash thoroughly, paying special attention to the area where your surgery  will be performed.  7.  Thoroughly rinse your body with warm water from the neck down.  8.  DO NOT shower/wash with your normal soap after using and rinsing off  the CHG Soap.                9.  Pat yourself dry with a clean towel.            10.  Wear clean pajamas.            11.  Place clean sheets on your bed the night of your first shower and do not  sleep with pets. Day of Surgery : Do not apply any lotions/deodorants the morning of surgery.  Please wear clean clothes to the hospital/surgery center.  FAILURE TO FOLLOW THESE INSTRUCTIONS MAY RESULT IN THE CANCELLATION OF YOUR SURGERY PATIENT SIGNATURE_________________________________  NURSE SIGNATURE__________________________________  ________________________________________________________________________   Bill [name]  An incentive spirometer is a tool that can help keep your lungs clear and active. This tool measures how well you are filling your lungs with each breath. Taking long deep breaths may help reverse or decrease the chance of developing breathing (pulmonary) problems (especially infection) following:  A long period of time when you are unable to move or be active. BEFORE THE PROCEDURE   If the spirometer includes an indicator to show your best effort, your nurse or respiratory therapist will set it to a desired goal.  If possible, sit up straight or lean slightly forward. Try not to slouch.  Hold the incentive spirometer in an upright position. INSTRUCTIONS FOR USE  1. Sit  on the edge of your bed if possible, or sit up as far as you can in bed or on a chair. 2. Hold the incentive spirometer in an upright position. 3. Breathe out normally. 4. Place the mouthpiece in your mouth and seal your lips tightly around it. 5. Breathe in slowly and as deeply as possible, raising the piston or the ball toward the top of the column. 6. Hold your breath for 3-5 seconds or for as long as possible. Allow the piston or ball to fall to the bottom of the column. 7. Remove the mouthpiece from your mouth and breathe out normally. 8. Rest for a few seconds and repeat Steps 1 through 7 at least 10 times every 1-2 hours when you are awake. Take your time and take a few normal breaths between deep breaths. 9. The spirometer may include an indicator to show your best effort. Use the indicator as a goal to work toward during each repetition. 10. After each set of 10 deep breaths, practice coughing to be sure your lungs are clear. If you have an incision (the cut made at the time of surgery), support your incision when coughing by placing a pillow or rolled up towels firmly against it. Once you are able to get out of bed, walk around indoors and cough well. You may stop using the incentive spirometer when  instructed by your caregiver.  RISKS AND COMPLICATIONS  Take your time so you do not get dizzy or light-headed.  If you are in pain, you may need to take or ask for pain medication before doing incentive spirometry. It is harder to take a deep breath if you are having pain. AFTER USE  Rest and breathe slowly and easily.  It can be helpful to keep track of a log of your progress. Your caregiver can provide you with a simple table to help with this. If you are using the spirometer at home, follow these instructions: [place] IF:   You are having difficultly using the spirometer.  You have trouble using the spirometer as often as instructed.  Your pain medication is not giving enough relief while using the spirometer.  You develop fever of 100.5 F (38.1 C) or higher. SEEK IMMEDIATE MEDICAL CARE IF:   You cough up bloody sputum that had not been present before.  You develop fever of 102 F (38.9 C) or greater.  You develop worsening pain at or near the incision site. MAKE SURE YOU:   Understand these instructions.  Will watch your condition.  Will get help right away if you are not doing well or get worse. Document Released: 03/03/2007 Document Revised: 01/13/2012 Document Reviewed: 05/04/2007 [facility] Patient Information 2014 [place], [place].   ________________________________________________________________________

## 2020-05-24 ENCOUNTER — Encounter (HOSPITAL_COMMUNITY): Payer: Self-pay

## 2020-05-24 ENCOUNTER — Other Ambulatory Visit: Payer: Self-pay

## 2020-05-24 ENCOUNTER — Ambulatory Visit (HOSPITAL_COMMUNITY)
Admission: RE | Admit: 2020-05-24 | Discharge: 2020-05-24 | Disposition: A | Discharge status: Home / Self Care | Payer: BC Managed Care – PPO | Source: Ambulatory Visit | Attending: Orthopedic Surgery | Admitting: Orthopedic Surgery

## 2020-05-24 ENCOUNTER — Encounter (HOSPITAL_COMMUNITY)
Admission: RE | Admit: 2020-05-24 | Discharge: 2020-05-24 | Disposition: A | Discharge status: Home / Self Care | Payer: BC Managed Care – PPO | Source: Ambulatory Visit | Attending: Orthopedic Surgery | Admitting: Orthopedic Surgery

## 2020-05-24 DIAGNOSIS — Z01818 Encounter for other preprocedural examination: Secondary | ICD-10-CM | POA: Diagnosis not present

## 2020-05-24 LAB — CBC WITH DIFFERENTIAL/PLATELET
Abs Immature Granulocytes: 0.01 10*3/uL (ref 0.00–0.07)
Basophils Absolute: 0.1 10*3/uL (ref 0.0–0.1)
Basophils Relative: 1 %
Eosinophils Absolute: 0.3 10*3/uL (ref 0.0–0.5)
Eosinophils Relative: 3 %
HCT: 39.3 % (ref 39.0–52.0)
Hemoglobin: 13.5 g/dL (ref 13.0–17.0)
Immature Granulocytes: 0 %
Lymphocytes Relative: 40 %
Lymphs Abs: 3 10*3/uL (ref 0.7–4.0)
MCH: 32.3 pg (ref 26.0–34.0)
MCHC: 34.4 g/dL (ref 30.0–36.0)
MCV: 94 fL (ref 80.0–100.0)
Monocytes Absolute: 0.5 10*3/uL (ref 0.1–1.0)
Monocytes Relative: 7 %
Neutro Abs: 3.6 10*3/uL (ref 1.7–7.7)
Neutrophils Relative %: 49 %
Platelets: 258 10*3/uL (ref 150–400)
RBC: 4.18 MIL/uL — ABNORMAL LOW (ref 4.22–5.81)
RDW: 12.9 % (ref 11.5–15.5)
WBC: 7.5 10*3/uL (ref 4.0–10.5)
nRBC: 0 % (ref 0.0–0.2)

## 2020-05-24 LAB — APTT: aPTT: 34 seconds (ref 24–36)

## 2020-05-24 LAB — BASIC METABOLIC PANEL
Anion gap: 11 (ref 5–15)
BUN: 28 mg/dL — ABNORMAL HIGH (ref 6–20)
CO2: 25 mmol/L (ref 22–32)
Calcium: 9.6 mg/dL (ref 8.9–10.3)
Chloride: 96 mmol/L — ABNORMAL LOW (ref 98–111)
Creatinine, Ser: 1.62 mg/dL — ABNORMAL HIGH (ref 0.61–1.24)
GFR calc Af Amer: 55 mL/min — ABNORMAL LOW (ref >60)
GFR calc non Af Amer: 48 mL/min — ABNORMAL LOW (ref >60)
Glucose, Bld: 431 mg/dL — ABNORMAL HIGH (ref 70–99)
Potassium: 4.1 mmol/L (ref 3.5–5.1)
Sodium: 132 mmol/L — ABNORMAL LOW (ref 135–145)

## 2020-05-24 LAB — SURGICAL PCR SCREEN
MRSA, PCR: NEGATIVE
Staphylococcus aureus: NEGATIVE

## 2020-05-24 LAB — URINALYSIS, ROUTINE W REFLEX MICROSCOPIC
Bacteria, UA: NONE SEEN
Bilirubin Urine: NEGATIVE
Glucose, UA: >=500 mg/dL — AB
Hgb urine dipstick: NEGATIVE
Ketones, ur: NEGATIVE mg/dL
Leukocytes,Ua: NEGATIVE
Nitrite: NEGATIVE
Protein, ur: NEGATIVE mg/dL
Specific Gravity, Urine: 1.017 (ref 1.005–1.030)
pH: 5 (ref 5.0–8.0)

## 2020-05-24 LAB — PROTIME-INR
INR: 1.2 (ref 0.8–1.2)
Prothrombin Time: 14.3 seconds (ref 11.4–15.2)

## 2020-05-24 NOTE — Progress Notes (Signed)
COVID Vaccine Completed:yes Date COVID Vaccine completed:02/03/20 COVID vaccine manufacturer: *Felicity   PCP - Dr. Rica Records. LOV: 03/07/20. EPIC Cardiologist -   Chest x-ray -  EKG -  Stress Test -  ECHO -  Cardiac Cath -   Sleep Study -  CPAP -   Fasting Blood Sugar -  Checks Blood Sugar _____ times a day  Blood Thinner Instructions: Aspirin Instructions: Last Dose:  Anesthesia review:   Patient denies shortness of breath, fever, cough and chest pain at PAT appointment   Patient verbalized understanding of instructions that were given to them at the PAT appointment. Patient was also instructed that they will need to review over the PAT instructions again at home before surgery.

## 2020-05-24 NOTE — Progress Notes (Signed)
Blood results: Blood glucose 431. BUN: 28 Cr: 1.62 Urine glucose: > 500

## 2020-05-25 ENCOUNTER — Telehealth: Payer: Self-pay | Admitting: Internal Medicine

## 2020-05-25 ENCOUNTER — Encounter (HOSPITAL_COMMUNITY): Payer: Self-pay | Admitting: Physician Assistant

## 2020-05-25 ENCOUNTER — Other Ambulatory Visit (HOSPITAL_COMMUNITY)
Admission: RE | Admit: 2020-05-25 | Discharge: 2020-05-25 | Disposition: A | Discharge status: Home / Self Care | Payer: BC Managed Care – PPO | Source: Ambulatory Visit | Attending: Orthopedic Surgery | Admitting: Orthopedic Surgery

## 2020-05-25 DIAGNOSIS — Z20822 Contact with and (suspected) exposure to covid-19: Secondary | ICD-10-CM | POA: Diagnosis not present

## 2020-05-25 DIAGNOSIS — Z01812 Encounter for preprocedural laboratory examination: Secondary | ICD-10-CM | POA: Insufficient documentation

## 2020-05-25 LAB — SARS CORONAVIRUS 2 (TAT 6-24 HRS): SARS Coronavirus 2: NEGATIVE

## 2020-05-25 NOTE — Telephone Encounter (Signed)
Noted and added to appointment

## 2020-05-25 NOTE — Telephone Encounter (Signed)
[facility] called to let Bill [name] know that the labs that the patient had at the hospital for his Pre-op for surgical clearance showed that his blood sugar was over 400 and they wanted Bill [name] to check the patient for diabetes at his visit 0/23/2021.  They are also sending over a fax with the lab results

## 2020-05-26 ENCOUNTER — Encounter: Payer: Self-pay | Admitting: Internal Medicine

## 2020-05-26 ENCOUNTER — Telehealth: Payer: Self-pay | Admitting: Internal Medicine

## 2020-05-26 ENCOUNTER — Ambulatory Visit: Payer: BC Managed Care – PPO | Admitting: Internal Medicine

## 2020-05-26 ENCOUNTER — Other Ambulatory Visit: Payer: Self-pay

## 2020-05-26 VITALS — BP 130/90 | HR 110 | Temp 98.8°F | Ht 71.0 in | Wt 252.7 lb

## 2020-05-26 DIAGNOSIS — IMO0002 Reserved for concepts with insufficient information to code with codable children: Secondary | ICD-10-CM | POA: Insufficient documentation

## 2020-05-26 DIAGNOSIS — E785 Hyperlipidemia, unspecified: Secondary | ICD-10-CM

## 2020-05-26 DIAGNOSIS — R739 Hyperglycemia, unspecified: Secondary | ICD-10-CM

## 2020-05-26 DIAGNOSIS — Z01818 Encounter for other preprocedural examination: Secondary | ICD-10-CM | POA: Diagnosis not present

## 2020-05-26 DIAGNOSIS — I10 Essential (primary) hypertension: Secondary | ICD-10-CM | POA: Diagnosis not present

## 2020-05-26 DIAGNOSIS — E119 Type 2 diabetes mellitus without complications: Secondary | ICD-10-CM | POA: Insufficient documentation

## 2020-05-26 DIAGNOSIS — E1165 Type 2 diabetes mellitus with hyperglycemia: Secondary | ICD-10-CM | POA: Insufficient documentation

## 2020-05-26 LAB — POCT GLYCOSYLATED HEMOGLOBIN (HGB A1C): Hemoglobin A1C: 9.4 % — AB (ref 4.0–5.6)

## 2020-05-26 MED ORDER — FREESTYLE LIBRE 2 READER DEVI: 1.0000 | Freq: Every day | 2 refills | Status: AC

## 2020-05-26 MED ORDER — METFORMIN HCL 1000 MG PO TABS: 1000.0000 mg | ORAL_TABLET | Freq: Two times a day (BID) | ORAL | 1 refills | Status: DC

## 2020-05-26 MED ORDER — FREESTYLE LIBRE 2 SENSOR MISC: 1.0000 | Freq: Every day | 2 refills | Status: DC

## 2020-05-26 NOTE — Progress Notes (Signed)
Established Patient Office Visit     This visit occurred during the SARS-CoV-2 public health emergency.  Safety protocols were in place, including screening questions prior to the visit, additional usage of staff PPE, and extensive cleaning of exam room while observing appropriate contact time as indicated for disinfecting solutions.    CC/Reason for Visit: Preoperative clearance  HPI: Bill [name] is a 54 y.o. male who is coming in today for the above mentioned reasons. Past Medical History is significant for: Hypertension, hyperlipidemia, gout and chronic right hip and knee pain.  He is scheduled for right hip surgery in 3 days and is coming today for surgical clearance.  He tells me that during his preoperative labs he was found to have a sugar of 400.  He is not known to be a diabetic.  He has been having elevated blood pressures as well.  He denies chest pain, shortness of breath.  He is obese.   Past Medical/Surgical History: Past Medical History:  Diagnosis Date  . Arthritis    left leg  . Gout   . Hypertension     Past Surgical History:  Procedure Laterality Date  . DG OPERATIVE RIGHT HIP ([place] HX)      Social History:  reports that he has never smoked. He has never used smokeless tobacco. He reports current alcohol use. He reports that he does not use drugs.  Allergies: Allergies  Allergen Reactions  . Dog Epithelium     Dog hair and dander    Family History:  Family History  Problem Relation Age of Onset  . Diabetes Father   . Colon cancer Neg Hx   . Esophageal cancer Neg Hx   . Rectal cancer Neg Hx   . Stomach cancer Neg Hx      Current Outpatient Medications:  .  allopurinol (ZYLOPRIM) 100 MG tablet, TAKE 1 TABLET BY MOUTH EVERY DAY (Patient taking differently: Take 100 mg by mouth daily. ), Disp: 90 tablet, Rfl: 1 .  hydrochlorothiazide (HYDRODIURIL) 25 MG tablet, Take 25 mg by mouth daily. , Disp: , Rfl:  .  lisinopril (ZESTRIL) 20 MG tablet,  Take 1 tablet (20 mg total) by mouth daily., Disp: 90 tablet, Rfl: 1 .  pantoprazole (PROTONIX) 40 MG tablet, Take 1 tablet (40 mg total) by mouth daily., Disp: 90 tablet, Rfl: 1 .  Vitamin D, Ergocalciferol, (DRISDOL) 1.25 MG (50000 UNIT) CAPS capsule, TAKE 1 CAPSULE (50,000 UNITS TOTAL) BY MOUTH EVERY 7 (SEVEN) DAYS., Disp: 12 capsule, Rfl: 0 .  metFORMIN (GLUCOPHAGE) 1000 MG tablet, Take 1 tablet (1,000 mg total) by mouth 2 (two) times daily with a meal., Disp: 180 tablet, Rfl: 1  Review of Systems:  Constitutional: Denies fever, chills, diaphoresis, appetite change and fatigue.  HEENT: Denies photophobia, eye pain, redness, hearing loss, ear pain, congestion, sore throat, rhinorrhea, sneezing, mouth sores, trouble swallowing, neck pain, neck stiffness and tinnitus.   Respiratory: Denies SOB, DOE, cough, chest tightness,  and wheezing.   Cardiovascular: Denies chest pain, palpitations and leg swelling.  Gastrointestinal: Denies nausea, vomiting, abdominal pain, diarrhea, constipation, blood in stool and abdominal distention.  Genitourinary: Denies dysuria, urgency, frequency, hematuria, flank pain and difficulty urinating.  Endocrine: Denies: hot or cold intolerance, sweats, changes in hair or nails, polyuria, polydipsia. Musculoskeletal: Denies myalgias, back pain, joint swelling, arthralgias and gait problem.  Skin: Denies pallor, rash and wound.  Neurological: Denies dizziness, seizures, syncope, weakness, light-headedness, numbness and headaches.  Hematological: Denies adenopathy. Easy  bruising, personal or family bleeding history  Psychiatric/Behavioral: Denies suicidal ideation, mood changes, confusion, nervousness, sleep disturbance and agitation    Physical Exam: Vitals:   05/26/20 1031  BP: (!) 130/90  Pulse: (!) 110  Temp: 98.8 F (37.1 C)  TempSrc: Oral  SpO2: 97%  Weight: (!) 252 lb 11.2 oz (114.6 kg)  Height: 5\' 11"  (1.803 m)    Body mass index is 35.24  kg/m.   Constitutional: NAD, calm, comfortable Eyes: PERRL, lids and conjunctivae normal ENMT: Mucous membranes are moist. Respiratory: clear to auscultation bilaterally, no wheezing, no crackles. Normal respiratory effort. No accessory muscle use.  Cardiovascular: Regular rate and rhythm, no murmurs / rubs / gallops. No extremity edema.  Neurologic: Grossly intact and nonfocal Psychiatric: Normal judgment and insight. Alert and oriented x 3. Normal mood.    Impression and Plan:  Preoperative clearance -Given the below issues, mainly newly diagnosed and uncontrolled diabetes, he will need some optimization prior to surgery. -EKG done in office today and interpreted by myself as sinus tachycardia at a rate of 105, left axis deviation, there are Q waves in the inferior leads but no acute ST or T wave changes.  Hyperlipidemia, unspecified hyperlipidemia type  - Plan: Lipid panel,  -Last LDL was 160 in 2017, goal is less than 70 now that he is at high -We will likely need a statin pending results.  Essential hypertension -Blood pressure is elevated today to 130/90. -He will do ambulatory blood pressure monitoring and return in 3 months for follow-up.  Uncontrolled type 2 diabetes mellitus with hyperglycemia ([place]) -This is a new diagnosis for him confirmed by a random blood sugar of over 400 and an A1c in office today of 9.4. -He will be sent for diabetes education. -We have extensively discussed lifestyle modifications today. -Start Metformin 1000 mg twice daily. -As we are trying to optimize him for surgery, I will have him follow-up with me in the office in 3 weeks to reassess glycemic control. -He has been given a prescription for a glucometer with supplies and has been given instruction on how to check his CBG.   Patient Instructions  -Nice seeing you today!!  -Lab work today; will notify you once results are available.  -Start metformin 1000 mg twice daily.  -Start  checking your sugar fasting in the morning and bring your chart in next week.  -See you back in 3 weeks for your diabetes.   Diabetes Mellitus and Nutrition, Adult When you have diabetes (diabetes mellitus), it is very important to have healthy eating habits because your blood sugar (glucose) levels are greatly affected by what you eat and drink. Eating healthy foods in the appropriate amounts, at about the same times every day, can help you:  Control your blood glucose.  Lower your risk of heart disease.  Improve your blood pressure.  Reach or maintain a healthy weight. Every person with diabetes is different, and each person has different needs for a meal plan. Your health care provider may recommend that you work with a diet and nutrition specialist (dietitian) to make a meal plan that is best for you. Your meal plan may vary depending on factors such as:  The calories you need.  The medicines you take.  Your weight.  Your blood glucose, blood pressure, and cholesterol levels.  Your activity level.  Other health conditions you have, such as heart or kidney disease. How do carbohydrates affect me? Carbohydrates, also called carbs, affect your blood glucose level  more than any other type of food. Eating carbs naturally raises the amount of glucose in your blood. Carb counting is a method for keeping track of how many carbs you eat. Counting carbs is important to keep your blood glucose at a healthy level, especially if you use insulin or take certain oral diabetes medicines. It is important to know how many carbs you can safely have in each meal. This is different for every person. Your dietitian can help you calculate how many carbs you should have at each meal and for each snack. Foods that contain carbs include:  Bread, cereal, rice, pasta, and crackers.  Potatoes and corn.  Peas, beans, and lentils.  Milk and yogurt.  Fruit and juice.  Desserts, such as cakes, cookies,  ice cream, and candy. How does alcohol affect me? Alcohol can cause a sudden decrease in blood glucose (hypoglycemia), especially if you use insulin or take certain oral diabetes medicines. Hypoglycemia can be a life-threatening condition. Symptoms of hypoglycemia (sleepiness, dizziness, and confusion) are similar to symptoms of having too much alcohol. If your health care provider says that alcohol is safe for you, follow these guidelines:  Limit alcohol intake to no more than 1 drink per day for nonpregnant women and 2 drinks per day for men. One drink equals 12 oz of beer, 5 oz of wine, or 1 oz of hard liquor.  Do not drink on an empty stomach.  Keep yourself hydrated with water, diet soda, or unsweetened iced tea.  Keep in mind that regular soda, juice, and other mixers may contain a lot of sugar and must be counted as carbs. What are tips for following this plan?  Reading food labels  Start by checking the serving size on the "Nutrition Facts" label of packaged foods and drinks. The amount of calories, carbs, fats, and other nutrients listed on the label is based on one serving of the item. Many items contain more than one serving per package.  Check the total grams (g) of carbs in one serving. You can calculate the number of servings of carbs in one serving by dividing the total carbs by 15. For example, if a food has 30 g of total carbs, it would be equal to 2 servings of carbs.  Check the number of grams (g) of saturated and trans fats in one serving. Choose foods that have low or no amount of these fats.  Check the number of milligrams (mg) of salt (sodium) in one serving. Most people should limit total sodium intake to less than 2,300 mg per day.  Always check the nutrition information of foods labeled as "low-fat" or "nonfat". These foods may be higher in added sugar or refined carbs and should be avoided.  Talk to your dietitian to identify your daily goals for nutrients listed  on the label. Shopping  Avoid buying canned, premade, or processed foods. These foods tend to be high in fat, sodium, and added sugar.  Shop around the outside edge of the grocery store. This includes fresh fruits and vegetables, bulk grains, fresh meats, and fresh dairy. Cooking  Use low-heat cooking methods, such as baking, instead of high-heat cooking methods like deep frying.  Cook using healthy oils, such as olive, canola, or sunflower oil.  Avoid cooking with butter, cream, or high-fat meats. Meal planning  Eat meals and snacks regularly, preferably at the same times every day. Avoid going long periods of time without eating.  Eat foods high in fiber, such as  fresh fruits, vegetables, beans, and whole grains. Talk to your dietitian about how many servings of carbs you can eat at each meal.  Eat 4-6 ounces (oz) of lean protein each day, such as lean meat, chicken, fish, eggs, or tofu. One oz of lean protein is equal to: ? 1 oz of meat, chicken, or fish. ? 1 egg. ?  cup of tofu.  Eat some foods each day that contain healthy fats, such as avocado, nuts, seeds, and fish. Lifestyle  Check your blood glucose regularly.  Exercise regularly as told by your health care provider. This may include: ? 150 minutes of moderate-intensity or vigorous-intensity exercise each week. This could be brisk walking, biking, or water aerobics. ? Stretching and doing strength exercises, such as yoga or weightlifting, at least 2 times a week.  Take medicines as told by your health care provider.  Do not use any products that contain nicotine or tobacco, such as cigarettes and e-cigarettes. If you need help quitting, ask your health care provider.  Work with a Social worker or diabetes educator to identify strategies to manage stress and any emotional and social challenges. Questions to ask a health care provider  Do I need to meet with a diabetes educator?  Do I need to meet with a  dietitian?  What number can I call if I have questions?  When are the best times to check my blood glucose? Where to find more information:  American Diabetes Association: diabetes.org  Academy of Nutrition and Dietetics: www.eatright.[employer] of Diabetes and Digestive and Kidney Diseases (NIH): [employer] Summary  A healthy meal plan will help you control your blood glucose and maintain a healthy lifestyle.  Working with a diet and nutrition specialist (dietitian) can help you make a meal plan that is best for you.  Keep in mind that carbohydrates (carbs) and alcohol have immediate effects on your blood glucose levels. It is important to count carbs and to use alcohol carefully. This information is not intended to replace advice given to you by your health care provider. Make sure you discuss any questions you have with your health care provider. Document Revised: 10/03/2017 Document Reviewed: 11/25/2016 Elsevier Patient Education  2020 [place], [place] [facility]

## 2020-05-26 NOTE — Patient Instructions (Signed)
-Nice seeing you today!!  -Lab work today; will notify you once results are available.  -Start metformin 1000 mg twice daily.  -Start checking your sugar fasting in the morning and bring your chart in next week.  -See you back in 3 weeks for your diabetes.   Diabetes Mellitus and Nutrition, Adult When you have diabetes (diabetes mellitus), it is very important to have healthy eating habits because your blood sugar (glucose) levels are greatly affected by what you eat and drink. Eating healthy foods in the appropriate amounts, at about the same times every day, can help you:  Control your blood glucose.  Lower your risk of heart disease.  Improve your blood pressure.  Reach or maintain a healthy weight. Every person with diabetes is different, and each person has different needs for a meal plan. Your health care provider may recommend that you work with a diet and nutrition specialist (dietitian) to make a meal plan that is best for you. Your meal plan may vary depending on factors such as:  The calories you need.  The medicines you take.  Your weight.  Your blood glucose, blood pressure, and cholesterol levels.  Your activity level.  Other health conditions you have, such as heart or kidney disease. How do carbohydrates affect me? Carbohydrates, also called carbs, affect your blood glucose level more than any other type of food. Eating carbs naturally raises the amount of glucose in your blood. Carb counting is a method for keeping track of how many carbs you eat. Counting carbs is important to keep your blood glucose at a healthy level, especially if you use insulin or take certain oral diabetes medicines. It is important to know how many carbs you can safely have in each meal. This is different for every person. Your dietitian can help you calculate how many carbs you should have at each meal and for each snack. Foods that contain carbs include:  Bread, cereal, rice, pasta,  and crackers.  Potatoes and corn.  Peas, beans, and lentils.  Milk and yogurt.  Fruit and juice.  Desserts, such as cakes, cookies, ice cream, and candy. How does alcohol affect me? Alcohol can cause a sudden decrease in blood glucose (hypoglycemia), especially if you use insulin or take certain oral diabetes medicines. Hypoglycemia can be a life-threatening condition. Symptoms of hypoglycemia (sleepiness, dizziness, and confusion) are similar to symptoms of having too much alcohol. If your health care provider says that alcohol is safe for you, follow these guidelines:  Limit alcohol intake to no more than 1 drink per day for nonpregnant women and 2 drinks per day for men. One drink equals 12 oz of beer, 5 oz of wine, or 1 oz of hard liquor.  Do not drink on an empty stomach.  Keep yourself hydrated with water, diet soda, or unsweetened iced tea.  Keep in mind that regular soda, juice, and other mixers may contain a lot of sugar and must be counted as carbs. What are tips for following this plan?  Reading food labels  Start by checking the serving size on the "Nutrition Facts" label of packaged foods and drinks. The amount of calories, carbs, fats, and other nutrients listed on the label is based on one serving of the item. Many items contain more than one serving per package.  Check the total grams (g) of carbs in one serving. You can calculate the number of servings of carbs in one serving by dividing the total carbs by 15. For  example, if a food has 30 g of total carbs, it would be equal to 2 servings of carbs.  Check the number of grams (g) of saturated and trans fats in one serving. Choose foods that have low or no amount of these fats.  Check the number of milligrams (mg) of salt (sodium) in one serving. Most people should limit total sodium intake to less than 2,300 mg per day.  Always check the nutrition information of foods labeled as "low-fat" or "nonfat". These foods  may be higher in added sugar or refined carbs and should be avoided.  Talk to your dietitian to identify your daily goals for nutrients listed on the label. Shopping  Avoid buying canned, premade, or processed foods. These foods tend to be high in fat, sodium, and added sugar.  Shop around the outside edge of the grocery store. This includes fresh fruits and vegetables, bulk grains, fresh meats, and fresh dairy. Cooking  Use low-heat cooking methods, such as baking, instead of high-heat cooking methods like deep frying.  Cook using healthy oils, such as olive, canola, or sunflower oil.  Avoid cooking with butter, cream, or high-fat meats. Meal planning  Eat meals and snacks regularly, preferably at the same times every day. Avoid going long periods of time without eating.  Eat foods high in fiber, such as fresh fruits, vegetables, beans, and whole grains. Talk to your dietitian about how many servings of carbs you can eat at each meal.  Eat 4-6 ounces (oz) of lean protein each day, such as lean meat, chicken, fish, eggs, or tofu. One oz of lean protein is equal to: ? 1 oz of meat, chicken, or fish. ? 1 egg. ?  cup of tofu.  Eat some foods each day that contain healthy fats, such as avocado, nuts, seeds, and fish. Lifestyle  Check your blood glucose regularly.  Exercise regularly as told by your health care provider. This may include: ? 150 minutes of moderate-intensity or vigorous-intensity exercise each week. This could be brisk walking, biking, or water aerobics. ? Stretching and doing strength exercises, such as yoga or weightlifting, at least 2 times a week.  Take medicines as told by your health care provider.  Do not use any products that contain nicotine or tobacco, such as cigarettes and e-cigarettes. If you need help quitting, ask your health care provider.  Work with a Social worker or diabetes educator to identify strategies to manage stress and any emotional and social  challenges. Questions to ask a health care provider  Do I need to meet with a diabetes educator?  Do I need to meet with a dietitian?  What number can I call if I have questions?  When are the best times to check my blood glucose? Where to find more information:  American Diabetes Association: diabetes.org  Academy of Nutrition and Dietetics: www.eatright.[employer] of Diabetes and Digestive and Kidney Diseases (NIH): [employer] Summary  A healthy meal plan will help you control your blood glucose and maintain a healthy lifestyle.  Working with a diet and nutrition specialist (dietitian) can help you make a meal plan that is best for you.  Keep in mind that carbohydrates (carbs) and alcohol have immediate effects on your blood glucose levels. It is important to count carbs and to use alcohol carefully. This information is not intended to replace advice given to you by your health care provider. Make sure you discuss any questions you have with your health care provider. Document Revised:  10/03/2017 Document Reviewed: 11/25/2016 Elsevier Patient Education  [place].

## 2020-05-26 NOTE — Telephone Encounter (Signed)
Note not available at this time.

## 2020-05-26 NOTE — Telephone Encounter (Signed)
Bill [name] from [facility] would like the notes and labs sent over to her form today's visit.  She will notate that pt is not cleared for surgery.  Please advise

## 2020-05-29 ENCOUNTER — Ambulatory Visit (HOSPITAL_COMMUNITY)
Admission: RE | Admit: 2020-05-29 | Payer: BC Managed Care – PPO | Source: Home / Self Care | Admitting: Orthopedic Surgery

## 2020-05-29 ENCOUNTER — Encounter (HOSPITAL_COMMUNITY): Admission: RE | Payer: Self-pay | Source: Home / Self Care

## 2020-05-29 LAB — TYPE AND SCREEN
ABO/RH(D): O POS
Antibody Screen: NEGATIVE

## 2020-05-29 SURGERY — ARTHROPLASTY, HIP, TOTAL,POSTERIOR APPROACH
Anesthesia: Spinal | Site: Hip | Laterality: Right

## 2020-06-20 ENCOUNTER — Encounter: Payer: Self-pay | Admitting: Skilled Nursing Facility1

## 2020-06-20 ENCOUNTER — Encounter: Payer: BC Managed Care – PPO | Attending: Internal Medicine | Admitting: Skilled Nursing Facility1

## 2020-06-20 ENCOUNTER — Other Ambulatory Visit: Payer: Self-pay

## 2020-06-20 DIAGNOSIS — E1165 Type 2 diabetes mellitus with hyperglycemia: Secondary | ICD-10-CM | POA: Insufficient documentation

## 2020-06-20 NOTE — Progress Notes (Signed)
Patient was seen on 06/20/2020 for the first of a series of three diabetes self-management courses at [facility].  Patient Education Plan per assessed needs and concerns is to attend three course education program for Diabetes Self Management Education.  Pt does have and use a freestyle libre (3-4 times a day).  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   The following learning objectives were met by the patient during this class:  Describe diabetes, types of diabetes and pathophysiology  State some common risk factors for diabetes  Defines the role of glucose and insulin  Describe the relationship between diabetes and cardiovascular and other risks  State the members of the Healthcare Team  States the rationale for glucose monitoring and when to test  State their individual [place] the importance of logging glucose readings and how to interpret the readings  Identifies A1C target  Explain the correlation between A1c and eAG values  State symptoms and treatment of high blood glucose and low blood glucose  Explain proper technique for glucose testing and identify proper sharps disposal  Handouts given during class include:  How to Thrive:  A Guide for Your Journey with Diabetes by the ADA  Meal Plan Card and carbohydrate content list  Dietary intake form  Low Sodium Flavoring Tips  Types of Fats  Dining Out  Label reading  Snack list  Planning a balanced meal  The diabetes portion plate  Diabetes Resources  A1c to eAG Conversion Chart  Blood Glucose Log  Diabetes Recommended Care Schedule  Support Group  Diabetes Success Plan  Core Class Satisfaction Survey   Follow-Up Plan:  Attend  core 2

## 2020-06-27 ENCOUNTER — Encounter: Payer: BC Managed Care – PPO | Admitting: Dietician

## 2020-06-27 ENCOUNTER — Encounter: Payer: Self-pay | Admitting: Dietician

## 2020-06-27 ENCOUNTER — Other Ambulatory Visit: Payer: Self-pay

## 2020-06-27 DIAGNOSIS — E1165 Type 2 diabetes mellitus with hyperglycemia: Secondary | ICD-10-CM | POA: Diagnosis not present

## 2020-06-27 NOTE — Progress Notes (Signed)
Patient was seen on 06/27/2020 for the second of a series of three diabetes self-management courses at the [facility]. The following learning objectives were met by the patient during this class:   Describe the role of different macronutrients on glucose  Explain how carbohydrates affect blood glucose  State what foods contain the most carbohydrates  Demonstrate carbohydrate counting  Demonstrate how to read Nutrition Facts food label  Describe effects of various fats on heart health  Describe the importance of good nutrition for health and healthy eating strategies  Describe techniques for managing your shopping, cooking and meal planning  List strategies to follow meal plan when dining out  Describe the effects of alcohol on glucose and how to use it safely  Goals:  Follow Diabetes Meal Plan as instructed  Aim to spread carbs evenly throughout the day  Aim for 3 meals per day and snacks as needed Include lean protein foods to meals/snacks  Monitor glucose levels as instructed by your doctor   Follow-Up Plan:  Attend Core 3  Work towards following your personal food plan.

## 2020-07-08 ENCOUNTER — Encounter: Payer: Self-pay | Admitting: Internal Medicine

## 2020-07-11 IMAGING — DX DG KNEE COMPLETE 4+V*R*
4 series · 4 of 4 positions shown · non-contrast
Comparison: None

CLINICAL DATA: Right knee pain.

EXAM:
RIGHT KNEE - COMPLETE 4+ VIEW

[knee ap]
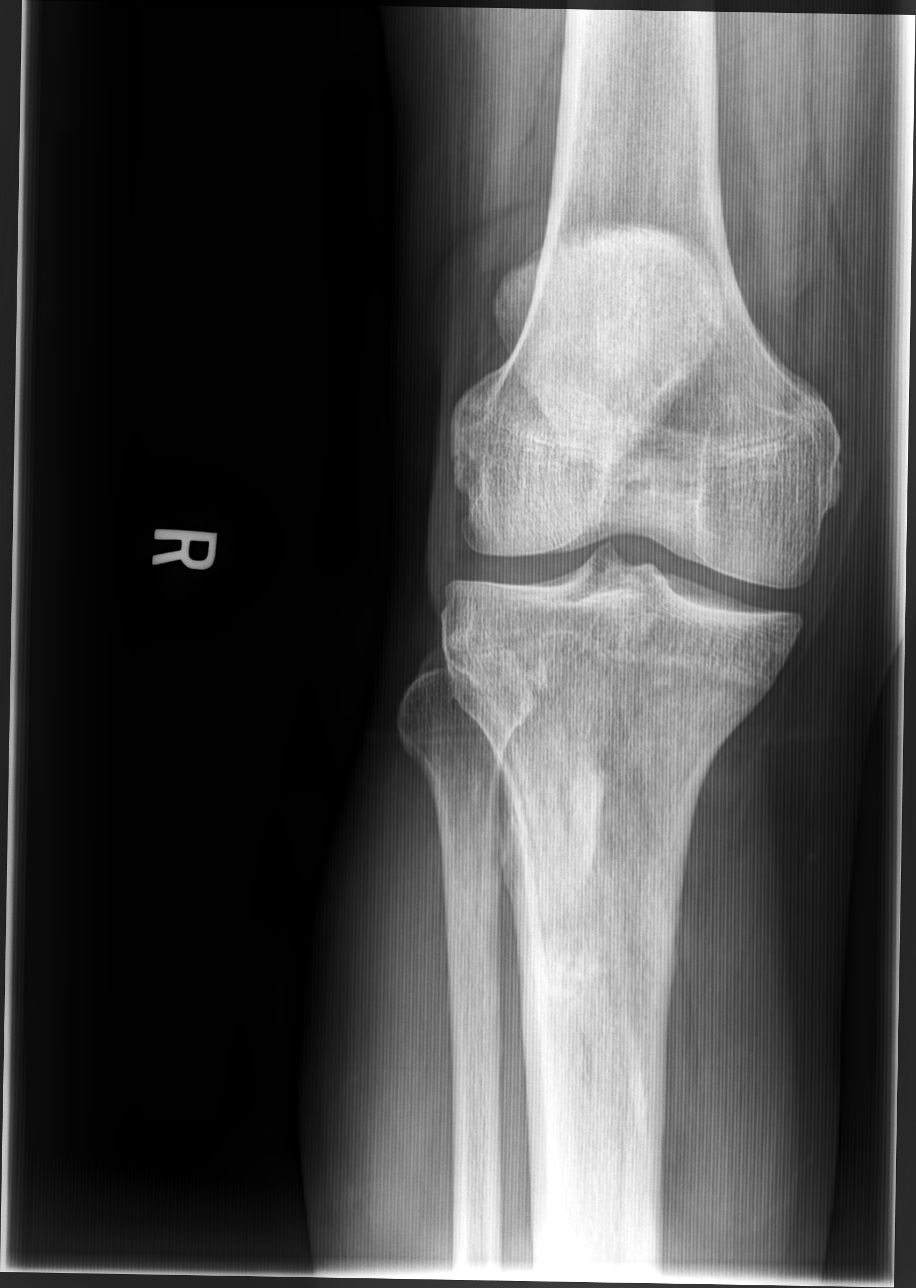

[knee lat]
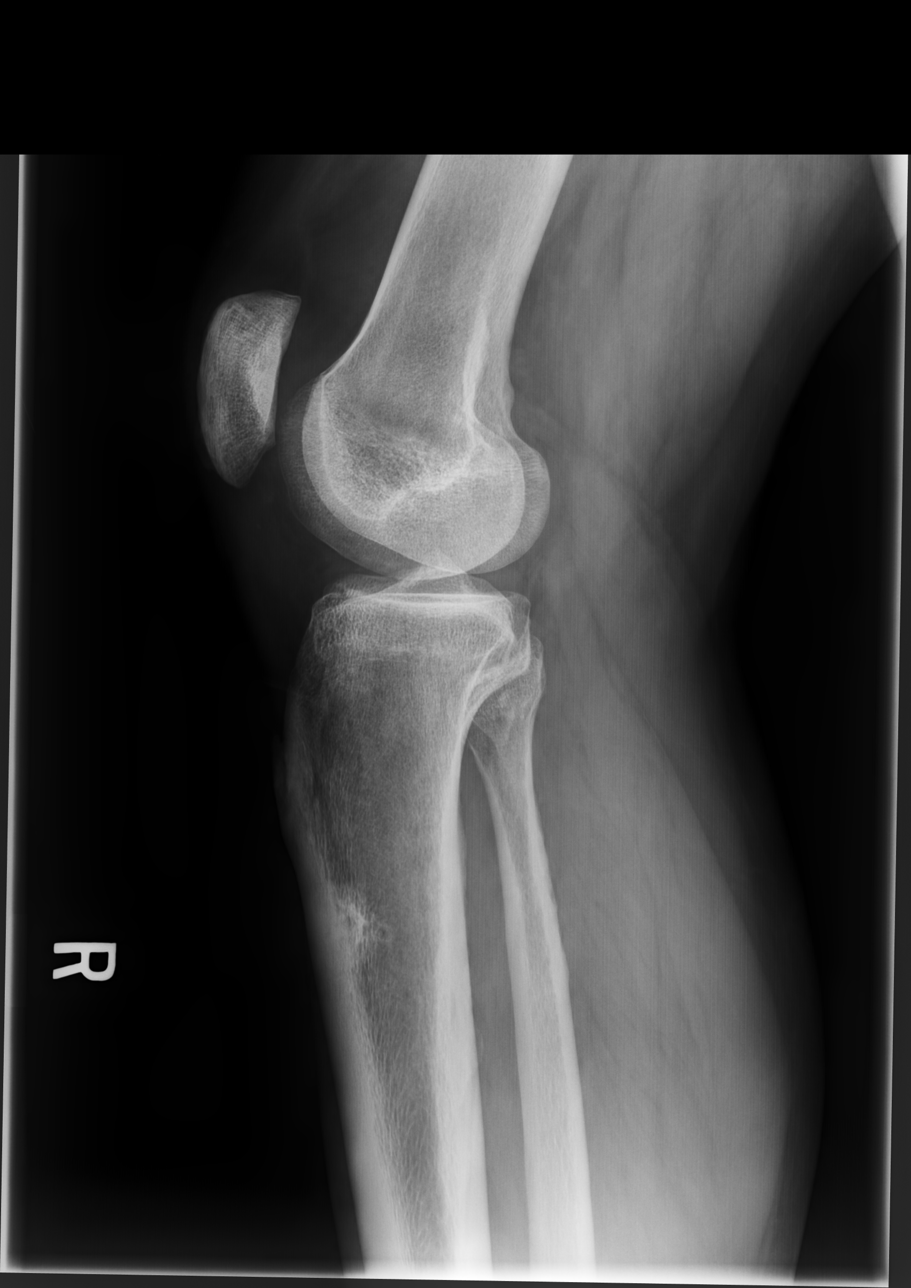

[knee (tunnel view) pa]
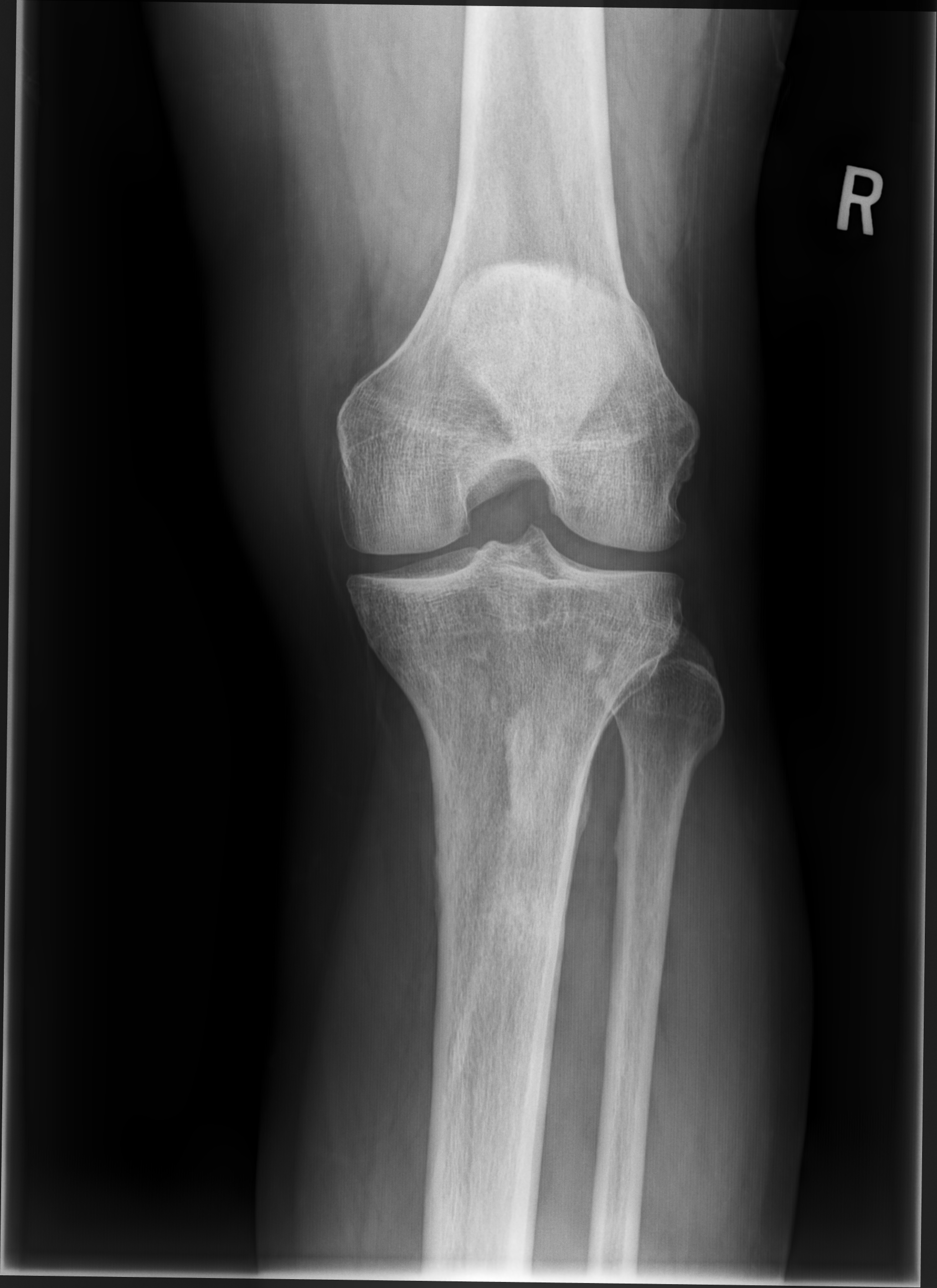

[patella (sunrise) tan]
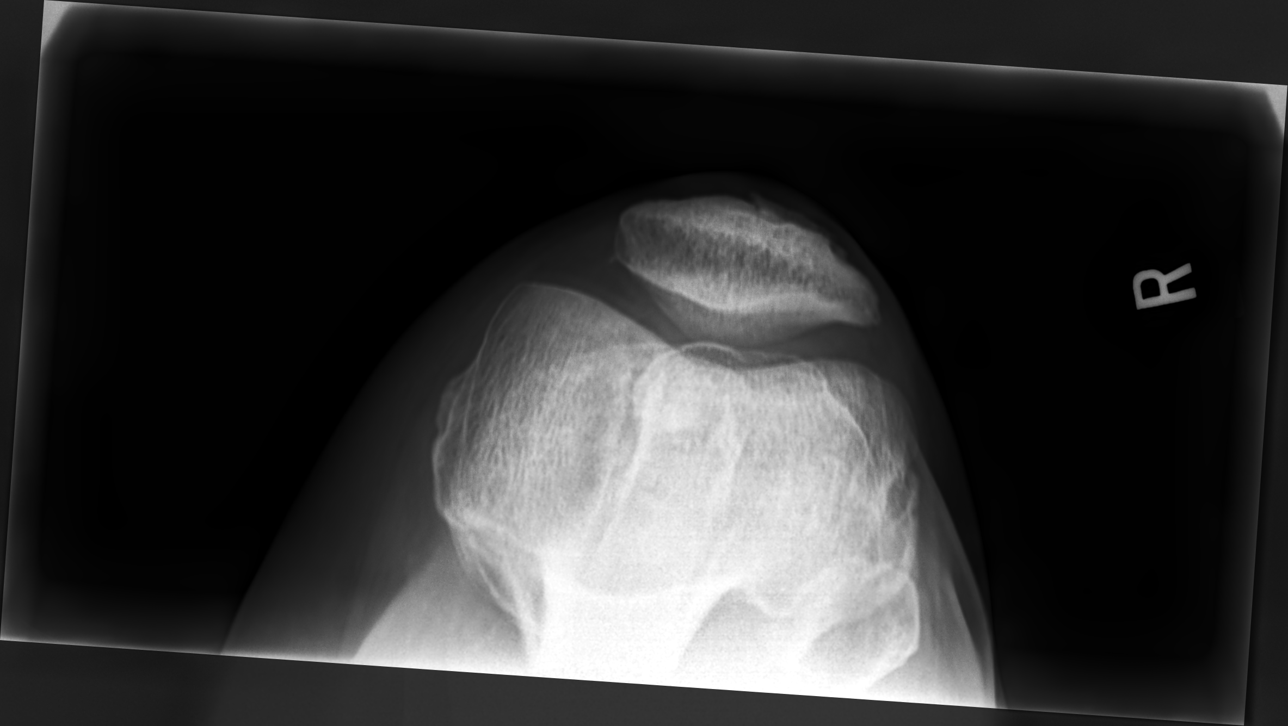

[4 of 4 positions shown; findings below may reference images not displayed]

FINDINGS: No joint effusion. No fracture or dislocation identified. Mild
sharpening of the tibial spines. Joint spaces are well preserved.
IMPRESSION: Minimal degenerative change.  No acute abnormality.

## 2020-07-12 MED ORDER — FREESTYLE LIBRE 2 SENSOR MISC: 1.0000 | Freq: Every day | 12 refills | Status: AC

## 2020-07-20 ENCOUNTER — Other Ambulatory Visit: Payer: Self-pay

## 2020-07-21 ENCOUNTER — Encounter: Payer: Self-pay | Admitting: Internal Medicine

## 2020-07-21 ENCOUNTER — Ambulatory Visit: Payer: BC Managed Care – PPO | Admitting: Internal Medicine

## 2020-07-21 VITALS — BP 140/100 | HR 80 | Temp 98.6°F | Wt 237.5 lb

## 2020-07-21 DIAGNOSIS — I10 Essential (primary) hypertension: Secondary | ICD-10-CM | POA: Diagnosis not present

## 2020-07-21 DIAGNOSIS — E1165 Type 2 diabetes mellitus with hyperglycemia: Secondary | ICD-10-CM | POA: Diagnosis not present

## 2020-07-21 NOTE — Progress Notes (Signed)
Established Patient Office Visit     This visit occurred during the SARS-CoV-2 public health emergency.  Safety protocols were in place, including screening questions prior to the visit, additional usage of staff PPE, and extensive cleaning of exam room while observing appropriate contact time as indicated for disinfecting solutions.    CC/Reason for Visit: Follow-up hypertension and newly diagnosed diabetes  HPI: Bill [name] is a 54 y.o. male who is coming in today for the above mentioned reasons. Past Medical History is significant for: Hypertension, hyperlipidemia, gout, newly diagnosed diabetes and chronic right hip and knee pain.  I saw him initially for preoperative clearance at the end of July.  At that time his surgery was delayed because during his preoperative labs he was found to have a CBG of 400.  He was started on Metformin.  He was placed on lisinopril and was scheduled to come here today for follow-up.  He has been following a healthier lifestyle and has been able to lose 5 pounds.  He thought that because of this he could quit taking his lisinopril.  His blood pressure is elevated today.  He has been doing much better in regards to diabetes.  He tells me that his highest blood sugar has been around 120, he has not had any hypoglycemic episodes.   Past Medical/Surgical History: Past Medical History:  Diagnosis Date  . Arthritis    left leg  . Gout   . Hypertension     Past Surgical History:  Procedure Laterality Date  . 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 He has never used smokeless tobacco. He reports current alcohol use. He reports that he does not use drugs.  Allergies: Allergies  Allergen Reactions  . Dog Epithelium     Dog hair and dander    Family History:  Family History  Problem Relation Age of Onset  . Diabetes Father   . Colon cancer Neg Hx   . Esophageal cancer Neg Hx   . Rectal cancer  Neg Hx   . Stomach cancer Neg Hx      Current Outpatient Medications:  .  allopurinol (ZYLOPRIM) 100 MG tablet, TAKE 1 TABLET BY MOUTH EVERY DAY (Patient taking differently: Take 100 mg by mouth daily. ), Disp: 90 tablet, Rfl: 1 .  Continuous Blood Gluc Receiver (FREESTYLE LIBRE 2 READER) DEVI, 1 each by Does not apply route daily., Disp: 1 each, Rfl: 2 .  Continuous Blood Gluc Sensor (FREESTYLE LIBRE 2 SENSOR) MISC, 1 each by Does not apply route daily., Disp: 1 each, Rfl: 12 .  hydrochlorothiazide (HYDRODIURIL) 25 MG tablet, Take 25 mg by mouth daily. , Disp: , Rfl:  .  lisinopril (ZESTRIL) 20 MG tablet, Take 1 tablet (20 mg total) by mouth daily., Disp: 90 tablet, Rfl: 1 .  metFORMIN (GLUCOPHAGE) 1000 MG tablet, Take 1 tablet (1,000 mg total) by mouth 2 (two) times daily with a meal., Disp: 180 tablet, Rfl: 1 .  pantoprazole (PROTONIX) 40 MG tablet, Take 1 tablet (40 mg total) by mouth daily., Disp: 90 tablet, Rfl: 1 .  Vitamin D, Ergocalciferol, (DRISDOL) 1.25 MG (50000 UNIT) CAPS capsule, TAKE 1 CAPSULE (50,000 UNITS TOTAL) BY MOUTH EVERY 7 (SEVEN) DAYS., Disp: 12 capsule, Rfl: 0  Review of Systems:  Constitutional: Denies fever, chills, diaphoresis, appetite change and fatigue.  HEENT: Denies photophobia, eye pain, redness, hearing loss, ear pain, congestion, sore  throat, rhinorrhea, sneezing, mouth sores, trouble swallowing, neck pain, neck stiffness and tinnitus.   Respiratory: Denies SOB, DOE, cough, chest tightness,  and wheezing.   Cardiovascular: Denies chest pain, palpitations and leg swelling.  Gastrointestinal: Denies nausea, vomiting, abdominal pain, diarrhea, constipation, blood in stool and abdominal distention.  Genitourinary: Denies dysuria, urgency, frequency, hematuria, flank pain and difficulty urinating.  Endocrine: Denies: hot or cold intolerance, sweats, changes in hair or nails, polyuria, polydipsia. Musculoskeletal: Denies myalgias, back pain, joint swelling,  arthralgias and gait problem.  Skin: Denies pallor, rash and wound.  Neurological: Denies dizziness, seizures, syncope, weakness, light-headedness, numbness and headaches.  Hematological: Denies adenopathy. Easy bruising, personal or family bleeding history  Psychiatric/Behavioral: Denies suicidal ideation, mood changes, confusion, nervousness, sleep disturbance and agitation    Physical Exam: Vitals:   07/21/20 1116  BP: (!) 140/100  Pulse: 80  Temp: 98.6 F (37 C)  TempSrc: Oral  SpO2: 97%  Weight: 237 lb 8 oz (107.7 kg)    Body mass index is 33.12 kg/m.   Constitutional: NAD, calm, comfortable Eyes: PERRL, lids and conjunctivae normal ENMT: Mucous membranes are moist.  Respiratory: clear to auscultation bilaterally, no wheezing, no crackles. Normal respiratory effort. No accessory muscle use.  Cardiovascular: Regular rate and rhythm, no murmurs / rubs / gallops. No extremity edema.  Neurologic: Grossly intact and nonfocal Psychiatric: Normal judgment and insight. Alert and oriented x 3. Normal mood.    Impression and Plan:  Uncontrolled type 2 diabetes mellitus with hyperglycemia ([place]) -Per report, it seems Metformin has been helping with improved glycemic control. -His A1c was 9.4 upon diagnosis at the end of July. -When he returns in 6 weeks we will repeat A1c.  Essential hypertension -Uncontrolled. -Have advised resumption of lisinopril. -He will return in 6 weeks for follow-up.     [name], MD [facility] at [facility]

## 2020-07-26 ENCOUNTER — Encounter: Payer: Self-pay | Admitting: Family Medicine

## 2020-07-26 MED ORDER — ALLOPURINOL 100 MG PO TABS
100.0000 mg | ORAL_TABLET | Freq: Every day | ORAL | 1 refills | Status: DC
Start: 2020-07-26 — End: 2022-04-09

## 2020-09-01 ENCOUNTER — Other Ambulatory Visit: Payer: Self-pay

## 2020-09-01 ENCOUNTER — Encounter: Payer: Self-pay | Admitting: Internal Medicine

## 2020-09-01 ENCOUNTER — Ambulatory Visit: Payer: BC Managed Care – PPO | Admitting: Internal Medicine

## 2020-09-01 VITALS — BP 150/90 | HR 75 | Temp 99.0°F | Wt 232.2 lb

## 2020-09-01 DIAGNOSIS — E1165 Type 2 diabetes mellitus with hyperglycemia: Secondary | ICD-10-CM | POA: Diagnosis not present

## 2020-09-01 DIAGNOSIS — I10 Essential (primary) hypertension: Secondary | ICD-10-CM

## 2020-09-01 LAB — POCT GLYCOSYLATED HEMOGLOBIN (HGB A1C): Hemoglobin A1C: 5.6 % (ref 4.0–5.6)

## 2020-09-01 MED ORDER — METFORMIN HCL 1000 MG PO TABS: 500.0000 mg | ORAL_TABLET | Freq: Every day | ORAL | 0 refills | Status: DC

## 2020-09-01 MED ORDER — LISINOPRIL 40 MG PO TABS: 40.0000 mg | ORAL_TABLET | Freq: Every day | ORAL | 1 refills | Status: DC

## 2020-09-01 NOTE — Progress Notes (Signed)
Established Patient Office Visit     This visit occurred during the SARS-CoV-2 public health emergency.  Safety protocols were in place, including screening questions prior to the visit, additional usage of staff PPE, and extensive cleaning of exam room while observing appropriate contact time as indicated for disinfecting solutions.    CC/Reason for Visit: Follow-up chronic conditions  HPI: Bill [name] is a 54 y.o. male who is coming in today for the above mentioned reasons. Past Medical History is significant for: Hypertension, hyperlipidemia, gout, newly diagnosed diabetes and chronic right hip and knee pain.  I saw him initially for preoperative clearance at the end of July.  At that time his surgery was delayed because during his preoperative labs he was found to have a CBG of 400.  He was started on Metformin.  He was placed on lisinopril and was scheduled to come here today for follow-up.  He has been following a healthier lifestyle and has been able to lose 15 pounds.  He has been taking lisinopril 20 mg daily as well as Metformin 1000 mg daily.  He brings in his meter.  His CBGs are mostly in the 90s to low 100 range with quite a few hypoglycemic episodes in the high 50s to mid 60s.  He has not been symptomatic during these episodes.  Blood pressure is elevated in office today at 150/90.   Past Medical/Surgical History: Past Medical History:  Diagnosis Date  . Arthritis    left leg  . Gout   . Hypertension     Past Surgical History:  Procedure Laterality Date  . 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 He has never used smokeless tobacco. He reports current alcohol use. He reports that he does not use drugs.  Allergies: Allergies  Allergen Reactions  . Dog Epithelium     Dog hair and dander    Family History:  Family History  Problem Relation Age of Onset  . Diabetes Father   . Colon cancer Neg Hx   .  Esophageal cancer Neg Hx   . Rectal cancer Neg Hx   . Stomach cancer Neg Hx      Current Outpatient Medications:  .  allopurinol (ZYLOPRIM) 100 MG tablet, Take 1 tablet (100 mg total) by mouth daily., Disp: 90 tablet, Rfl: 1 .  Continuous Blood Gluc Receiver (FREESTYLE LIBRE 2 READER) DEVI, 1 each by Does not apply route daily., Disp: 1 each, Rfl: 2 .  Continuous Blood Gluc Sensor (FREESTYLE LIBRE 2 SENSOR) MISC, 1 each by Does not apply route daily., Disp: 1 each, Rfl: 12 .  lisinopril (ZESTRIL) 40 MG tablet, Take 1 tablet (40 mg total) by mouth daily., Disp: 90 tablet, Rfl: 1 .  metFORMIN (GLUCOPHAGE) 1000 MG tablet, Take 0.5 tablets (500 mg total) by mouth daily with breakfast., Disp: 90 tablet, Rfl: 0 .  pantoprazole (PROTONIX) 40 MG tablet, Take 1 tablet (40 mg total) by mouth daily., Disp: 90 tablet, Rfl: 1  Review of Systems:  Constitutional: Denies fever, chills, diaphoresis, appetite change and fatigue.  HEENT: Denies photophobia, eye pain, redness, hearing loss, ear pain, congestion, sore throat, rhinorrhea, sneezing, mouth sores, trouble swallowing, neck pain, neck stiffness and tinnitus.   Respiratory: Denies SOB, DOE, cough, chest tightness,  and wheezing.   Cardiovascular: Denies chest pain, palpitations and leg swelling.  Gastrointestinal: Denies nausea, vomiting, abdominal pain, diarrhea, constipation, blood in stool  and abdominal distention.  Genitourinary: Denies dysuria, urgency, frequency, hematuria, flank pain and difficulty urinating.  Endocrine: Denies: hot or cold intolerance, sweats, changes in hair or nails, polyuria, polydipsia. Musculoskeletal: Denies myalgias, back pain, joint swelling, arthralgias and gait problem.  Skin: Denies pallor, rash and wound.  Neurological: Denies dizziness, seizures, syncope, weakness, light-headedness, numbness and headaches.  Hematological: Denies adenopathy. Easy bruising, personal or family bleeding history    Psychiatric/Behavioral: Denies suicidal ideation, mood changes, confusion, nervousness, sleep disturbance and agitation    Physical Exam: Vitals:   09/01/20 1120  BP: (!) 150/90  Pulse: 75  Temp: 99 F (37.2 C)  TempSrc: Oral  SpO2: 98%  Weight: 232 lb 3.2 oz (105.3 kg)    Body mass index is 32.39 kg/m.   Constitutional: NAD, calm, comfortable Eyes: PERRL, lids and conjunctivae normal ENMT: Mucous membranes are moist.  Respiratory: clear to auscultation bilaterally, no wheezing, no crackles. Normal respiratory effort. No accessory muscle use.  Cardiovascular: Regular rate and rhythm, no murmurs / rubs / gallops. No extremity edema. .  Neurologic: Grossly intact and nonfocal Psychiatric: Normal judgment and insight. Alert and oriented x 3. Normal mood.    Impression and Plan:  Uncontrolled type 2 diabetes mellitus with hyperglycemia (Bill [name]) -A1c is down to 5.6 today from 9.4 upon diagnosis in July. -Due to hypoglycemic episodes I will decrease Metformin from 1000 mg daily to 500 mg daily.  Primary hypertension -Not well controlled, increase lisinopril from 20 to 40 mg and return in 6 weeks for follow-up.    Patient Instructions  -Nice seeing you today!!  -Increase lisinopril to 40 mg daily.  -Take metformin 500 mg in the morning (half of the 1000 mg tablet).  -Schedule follow up in 6 weeks. Please come in fasting that day so we can check your labs.     [name], MD [facility]

## 2020-09-01 NOTE — Patient Instructions (Signed)
-  Nice seeing you today!!  -Increase lisinopril to 40 mg daily.  -Take metformin 500 mg in the morning (half of the 1000 mg tablet).  -Schedule follow up in 6 weeks. Please come in fasting that day so we can check your labs.

## 2020-09-03 ENCOUNTER — Other Ambulatory Visit: Payer: Self-pay | Admitting: Internal Medicine

## 2020-09-03 DIAGNOSIS — I10 Essential (primary) hypertension: Secondary | ICD-10-CM

## 2020-09-03 DIAGNOSIS — K219 Gastro-esophageal reflux disease without esophagitis: Secondary | ICD-10-CM

## 2020-09-19 ENCOUNTER — Other Ambulatory Visit: Payer: Self-pay | Admitting: Orthopedic Surgery

## 2020-09-26 ENCOUNTER — Other Ambulatory Visit: Payer: Self-pay | Admitting: *Deleted

## 2020-09-26 NOTE — Telephone Encounter (Signed)
Form faxed and confirmed

## 2020-09-26 NOTE — Telephone Encounter (Signed)
Patient called back and stated that he did want to use [facility] for diabetes supplies. CB is [phone]

## 2020-09-26 NOTE — Telephone Encounter (Signed)
Left message on machine for patient to return our call.  We received a fax from [facility] requesting prescriptions for diabetes supplies. Would patient like to use [facility]?

## 2020-10-09 ENCOUNTER — Telehealth: Payer: BC Managed Care – PPO | Admitting: Emergency Medicine

## 2020-10-09 ENCOUNTER — Encounter: Payer: Self-pay | Admitting: Family Medicine

## 2020-10-09 ENCOUNTER — Encounter: Payer: Self-pay | Admitting: Internal Medicine

## 2020-10-09 DIAGNOSIS — M1611 Unilateral primary osteoarthritis, right hip: Secondary | ICD-10-CM | POA: Diagnosis present

## 2020-10-09 DIAGNOSIS — M79675 Pain in left toe(s): Secondary | ICD-10-CM | POA: Diagnosis not present

## 2020-10-09 MED ORDER — COLCHICINE 0.6 MG PO TABS: 0.6000 mg | ORAL_TABLET | Freq: Two times a day (BID) | ORAL | 0 refills | Status: DC

## 2020-10-09 NOTE — H&P (Signed)
TOTAL HIP ADMISSION H&P  Patient is admitted for right total hip arthroplasty.  Subjective:  Chief Complaint: right hip pain  HPI: Bill [name], 54 y.o. male, has a history of pain and functional disability in the right hip(s) due to trauma and arthritis and patient has failed non-surgical conservative treatments for greater than 12 weeks to include NSAID's and/or analgesics, use of assistive devices, weight reduction as appropriate and activity modification.  Onset of symptoms was gradual starting 1 years ago with rapidlly worsening course since that time.The patient noted prior procedures of the hip to include very bad acetabular fracture that required posterior plating by Dr. Patrecia Pour at [facility] and  multiple screws in the periacetabular region as well as a posterior plate on the right hip(s).  Patient currently rates pain in the right hip at 10 out of 10 with activity. Patient has night pain, worsening of pain with activity and weight bearing, trendelenberg gait, pain that interfers with activities of daily living and pain with passive range of motion. Patient has evidence of joint subluxation and joint space narrowing by imaging studies. This condition presents safety issues increasing the risk of falls.  There is no current active infection.  Patient Active Problem List   Diagnosis Date Noted  . Osteoarthritis of right hip 10/09/2020  . DM (diabetes mellitus), type 2, uncontrolled ([place]) 05/26/2020  . Post-traumatic osteoarthritis of right hip 12/26/2016  . Right hip pain 12/26/2016  . Chronic pain of right knee 12/26/2016  . S/P right hip fracture 12/26/2016  . Hyperlipemia 01/05/2016  . Chronic gout 12/01/2015  . Hypertension 12/01/2015  . Environmental allergies 12/01/2015   Past Medical History:  Diagnosis Date  . Arthritis    left leg  . Gout   . Hypertension     Past Surgical History:  Procedure Laterality Date  . DG OPERATIVE RIGHT HIP ([place] HX)      No current  facility-administered medications for this encounter.   Current Outpatient Medications  Medication Sig Dispense Refill Last Dose  . acetaminophen (TYLENOL) 500 MG tablet Take 500 mg by mouth every 6 (six) hours as needed for moderate pain or headache.     . allopurinol (ZYLOPRIM) 100 MG tablet Take 1 tablet (100 mg total) by mouth daily. 90 tablet 1   . lisinopril (ZESTRIL) 40 MG tablet Take 1 tablet (40 mg total) by mouth daily. 90 tablet 1   . metFORMIN (GLUCOPHAGE) 1000 MG tablet Take 0.5 tablets (500 mg total) by mouth daily with breakfast. 90 tablet 0   . pantoprazole (PROTONIX) 40 MG tablet TAKE 1 TABLET BY MOUTH EVERY DAY (Patient taking differently: Take 40 mg by mouth daily. ) 90 tablet 1   . Continuous Blood Gluc Receiver (FREESTYLE LIBRE 2 READER) DEVI 1 each by Does not apply route daily. 1 each 2   . Continuous Blood Gluc Sensor (FREESTYLE LIBRE 2 SENSOR) MISC 1 each by Does not apply route daily. 1 each 12   . lisinopril (ZESTRIL) 20 MG tablet TAKE 1 TABLET BY MOUTH EVERY DAY (Patient not taking: Reported on 10/06/2020) 90 tablet 1 Not Taking at Unknown time   Allergies  Allergen Reactions  . Dog Epithelium     Dog hair and dander    Social History   Tobacco Use  . Smoking status: Never Smoker  . Smokeless tobacco: Never Used  Substance Use Topics  . Alcohol use: Yes    Comment: ocass.    Family History  Problem Relation Age  of Onset  . Diabetes Father   . Colon cancer Neg Hx   . Esophageal cancer Neg Hx   . Rectal cancer Neg Hx   . Stomach cancer Neg Hx      Review of Systems  Constitutional: Negative.   HENT: Negative.   Eyes: Negative.   Respiratory: Negative.   Cardiovascular:       HTN  Gastrointestinal: Negative.   Endocrine: Negative.   Genitourinary: Negative.   Musculoskeletal: Positive for arthralgias.  Skin: Negative.   Allergic/Immunologic: Negative.   Neurological: Negative.   Hematological: Negative.   Psychiatric/Behavioral: Negative.      Objective:  Physical Exam Constitutional:      Appearance: Normal appearance. He is obese.  HENT:     Head: Normocephalic and atraumatic.     Nose: Nose normal.  Eyes:     Pupils: Pupils are equal, round, and reactive to light.  Cardiovascular:     Pulses: Normal pulses.  Pulmonary:     Effort: Pulmonary effort is normal.  Musculoskeletal:     Cervical back: Normal range of motion and neck supple.     Comments: Patient walks with a right-sided limp has significant pain with any attempts at internal rotation.  Foot tap is mildly positive   Skin:    General: Skin is warm and dry.  Neurological:     General: No focal deficit present.     Mental Status: He is alert and oriented to person, place, and time. Mental status is at baseline.  Psychiatric:        Mood and Affect: Mood normal.        Behavior: Behavior normal.        Thought Content: Thought content normal.        Judgment: Judgment normal.     Vital signs in last 24 hours: BP: ()/()  Arterial Line BP: ()/()   Labs:   Estimated body mass index is 32.39 kg/m as calculated from the following:   Height as of 05/26/20: 5\' 11"  (1.803 m).   Weight as of 09/01/20: 105.3 kg.   Imaging Review Plain radiographs demonstrate  AP pelvis and crosstable lateral show posterior pelvic plate there is 1 screw the may actually be in the joint itself but is not part of the plate construct.  It is a smaller 3.5 cannulated screw best seen on the shoot through lateral.  There is no joint space large peripheral osteophytes.   Assessment/Plan:  Post traumatic End stage arthritis, right hip(s)  The patient history, physical examination, clinical judgement of the provider and imaging studies are consistent with end stage degenerative joint disease of the right hip(s) and total hip arthroplasty is deemed medically necessary. The treatment options including medical management, injection therapy, arthroscopy and arthroplasty were  discussed at length. The risks and benefits of total hip arthroplasty were presented and reviewed. The risks due to aseptic loosening, infection, stiffness, dislocation/subluxation,  thromboembolic complications and other imponderables were discussed.  The patient acknowledged the explanation, agreed to proceed with the plan and consent was signed. Patient is being admitted for inpatient treatment for surgery, pain control, PT, OT, prophylactic antibiotics, VTE prophylaxis, progressive ambulation and ADL's and discharge planning.The patient is planning to be discharged home with home health services    Patient's anticipated LOS is less than 2 midnights, meeting these requirements: - Younger than 13 - Lives within 1 hour of care - Has a competent adult at home to recover with post-op recover - NO  history of  - Chronic pain requiring opiods  - Diabetes  - Coronary Artery Disease  - Heart failure  - Heart attack  - Stroke  - DVT/VTE  - Cardiac arrhythmia  - Respiratory Failure/COPD  - Renal failure  - Anemia  - Advanced Liver disease

## 2020-10-09 NOTE — Progress Notes (Signed)
E-Visit for Gout  We are sorry that you are not feeling well. We are here to help!  Based on what you shared with me it looks like you have a flare of your gout.  Gout is a form of arthritis. It can cause pain and swelling in the joints. At first, it tends to affect only 1 joint - most frequently the big toe. It happens in people who have too much uric acid in the blood. Uric acid is a chemical that is produced when the body breaks down certain foods. Uric acid can form sharp needle-like crystals that build up in the joints and cause pain. Uric acid crystals can also form inside the tubes that carry urine from the kidneys to the bladder. These crystals can turn into "kidney stones" that can cause pain and problems with the flow of urine. People with gout get sudden "flares" or attacks of severe pain, most often the big toe, ankle, or knee. Often the joint also turns red and swells. Usually, only 1 joint is affected, but some people have pain in more than 1 joint. Gout flares tend to happen more often during the night.  The pain from gout can be extreme. The pain and swelling are worst at the beginning of a gout flare. The symptoms then get better within a few days to weeks. It is not clear how the body "turns off" a gout flare.  Do not start any NEW preventative medicine until the gout has cleared completely. However, If you are already on Probenecid or Allopurinol for CHRONIC gout, you may continue taking this during an active flare up  I have prescribed Colchicine 0.6 mg tabs - Take 2 tabs immediately, then 1 tab twice per day for the duration of the flare up to a max of 7 days (but discontinue for stomach pains or diarrhea)   *Don't take if you have kidney disease, liver disease or gall bladder problems.  HOME CARE Losing weight can help relieve gout. It's not clear that following a specific diet plan will help with gout symptoms but eating a balanced diet can help improve your overall health. It  can also help you lose weight, if you are overweight. In general, a healthy diet includes plenty of fruits, vegetables, whole grains, and low-fat dairy products (labelled "low fat", skim, 2%). Avoid sugar sweetened drinks (including sodas, tea, juice and juice blends, coffee drinks and sports drinks) Limit alcohol to 1-2 drinks of beer, spirits or wine daily these can make gout flares worse. Some people with gout also have other health problems, such as heart disease, high blood pressure, kidney disease, or obesity. If you have any of these issues, it's important to work with your doctor to manage them. This can help improve your overall health and might also help with your gout.  GET HELP RIGHT AWAY IF: . Your symptoms persist after you have completed your treatment plan . You develop severe diarrhea . You develop abnormal sensations .  You develop vomiting,  .  You develop weakness .  You develop abdominal pain  FOLLOW UP WITH YOUR PRIMARY PROVIDER IF: . If your symptoms do not improve within 10 days  MAKE SURE YOU   Understand these instructions.  Will watch your condition.  Will get help right away if you are not doing well or get worse.  Your e-visit answers were reviewed by a board certified advanced clinical practitioner to complete your personal care plan. Depending upon the condition, your  plan could have included both over the counter or prescription medications.  Your safety is important to [place]. If you have drug allergies check your prescription carefully.   You can use MyChart to ask questions about today's visit, request a non-urgent call back, or ask for a work or school excuse for 24 hours related to this e-Visit. If it has been greater than 24 hours you will need to follow up with your provider, or enter a new e-Visit to address those concerns. You will get an e-mail with a link to a survey asking about your experience.  We hope that your e-visit has been valuable and will  speed your recovery! Thank you for using e-visits.      Approximately 5 minutes was used in reviewing the patient's chart, questionnaire, prescribing medications, and documentation.

## 2020-10-09 NOTE — Progress Notes (Signed)
DUE TO COVID-19 ONLY ONE VISITOR IS ALLOWED TO COME WITH YOU AND STAY IN THE WAITING ROOM ONLY DURING PRE OP AND PROCEDURE DAY OF SURGERY. THE 1 VISITOR  MAY VISIT WITH YOU AFTER SURGERY IN YOUR PRIVATE ROOM DURING VISITING HOURS ONLY!  YOU NEED TO HAVE A COVID 19 TEST ON_12/07/2020 ______ @_______ , THIS TEST MUST BE DONE BEFORE SURGERY,  COVID TESTING SITE [address] [place] [address], IT IS ON THE RIGHT GOING OUT [address] APPROXIMATELY  2 MINUTES PAST [employer] ON THE RIGHT. ONCE YOUR COVID TEST IS COMPLETED,  PLEASE BEGIN THE QUARANTINE INSTRUCTIONS AS OUTLINED IN YOUR HANDOUT.                Bill [name]  10/09/2020   Your procedure is scheduled on: 10/16/2020    Report to [facility] Main  Entrance   Report to admitting at    [place] AM     Call this number if you have problems the morning of surgery [phone]    REMEMBER: NO  SOLID FOOD CANDY OR GUM AFTER MIDNIGHT. CLEAR LIQUIDS UNTIL    0600am     . NOTHING BY MOUTH EXCEPT CLEAR LIQUIDS UNTIL    . PLEASE FINISH ENSURE DRINK PER SURGEON ORDER  WHICH NEEDS TO BE COMPLETED AT   0600am   .      CLEAR LIQUID DIET   Foods Allowed                                                                    Coffee and tea, regular and decaf                            Fruit ices (not with fruit pulp)                                      Iced Popsicles                                    Carbonated beverages, regular and diet                                    Cranberry, grape and apple juices Sports drinks like Gatorade Lightly seasoned clear broth or consume(fat free) Sugar, honey syrup ___________________________________________________________________      BRUSH YOUR TEETH MORNING OF SURGERY AND RINSE YOUR MOUTH OUT, NO CHEWING GUM CANDY OR MINTS.     Take these medicines the morning of surgery with A SIP OF WATER: Alolpurinol, colchicine, Protonix   DO NOT TAKE ANY DIABETIC MEDICATIONS DAY OF  YOUR SURGERY                               You may not have any metal on your body including hair pins and              piercings  Do not  wear jewelry, make-up, lotions, powders or perfumes, deodorant             Do not wear nail polish on your fingernails.  Do not shave  48 hours prior to surgery.              Men may shave face and neck.   Do not bring valuables to the hospital. [place].  Contacts, dentures or bridgework may not be worn into surgery.  Leave suitcase in the car. After surgery it may be brought to your room.     Patients discharged the day of surgery will not be allowed to drive home. IF YOU ARE HAVING SURGERY AND GOING HOME THE SAME DAY, YOU MUST HAVE AN ADULT TO DRIVE YOU HOME AND BE WITH YOU FOR 24 HOURS. YOU MAY GO HOME BY TAXI OR UBER OR ORTHERWISE, BUT AN ADULT MUST ACCOMPANY YOU HOME AND STAY WITH YOU FOR 24 HOURS.  Name and phone number of your driver:  Special Instructions: N/A              Please read over the following fact sheets you were given: _____________________________________________________________________  [facility] - Preparing for Surgery Before surgery, you can play an important role.  Because skin is not sterile, your skin needs to be as free of germs as possible.  You can reduce the number of germs on your skin by washing with CHG (chlorahexidine gluconate) soap before surgery.  CHG is an antiseptic cleaner which kills germs and bonds with the skin to continue killing germs even after washing. Please DO NOT use if you have an allergy to CHG or antibacterial soaps.  If your skin becomes reddened/irritated stop using the CHG and inform your nurse when you arrive at Short Stay. Do not shave (including legs and underarms) for at least 48 hours prior to the first CHG shower.  You may shave your face/neck. Please follow these instructions carefully:  1.  Shower with CHG Soap the night before surgery and  the  morning of Surgery.  2.  If you choose to wash your hair, wash your hair first as usual with your  normal  shampoo.  3.  After you shampoo, rinse your hair and body thoroughly to remove the  shampoo.                           4.  Use CHG as you would any other liquid soap.  You can apply chg directly  to the skin and wash                       Gently with a scrungie or clean washcloth.  5.  Apply the CHG Soap to your body ONLY FROM THE NECK DOWN.   Do not use on face/ open                           Wound or open sores. Avoid contact with eyes, ears mouth and genitals (private parts).                       Wash face,  Genitals (private parts) with your normal soap.             6.  Wash thoroughly, paying  special attention to the area where your surgery  will be performed.  7.  Thoroughly rinse your body with warm water from the neck down.  8.  DO NOT shower/wash with your normal soap after using and rinsing off  the CHG Soap.                9.  Pat yourself dry with a clean towel.            10.  Wear clean pajamas.            11.  Place clean sheets on your bed the night of your first shower and do not  sleep with pets. Day of Surgery : Do not apply any lotions/deodorants the morning of surgery.  Please wear clean clothes to the hospital/surgery center.  FAILURE TO FOLLOW THESE INSTRUCTIONS MAY RESULT IN THE CANCELLATION OF YOUR SURGERY PATIENT SIGNATURE_________________________________  NURSE SIGNATURE__________________________________  ________________________________________________________________________

## 2020-10-10 NOTE — Telephone Encounter (Signed)
Patient was seen at an evisit.  Rx was refilled by Montine Circle, PA-C.

## 2020-10-11 ENCOUNTER — Encounter (HOSPITAL_COMMUNITY): Payer: Self-pay

## 2020-10-11 ENCOUNTER — Encounter (HOSPITAL_COMMUNITY)
Admission: RE | Admit: 2020-10-11 | Discharge: 2020-10-11 | Disposition: A | Discharge status: Home / Self Care | Payer: BC Managed Care – PPO | Source: Ambulatory Visit | Attending: Orthopedic Surgery | Admitting: Orthopedic Surgery

## 2020-10-11 ENCOUNTER — Other Ambulatory Visit: Payer: Self-pay

## 2020-10-11 DIAGNOSIS — Z01812 Encounter for preprocedural laboratory examination: Secondary | ICD-10-CM | POA: Insufficient documentation

## 2020-10-11 HISTORY — DX: Type 2 diabetes mellitus without complications: E11.9

## 2020-10-11 HISTORY — DX: Gastro-esophageal reflux disease without esophagitis: K21.9

## 2020-10-11 LAB — BASIC METABOLIC PANEL
Anion gap: 12 (ref 5–15)
BUN: 12 mg/dL (ref 6–20)
CO2: 23 mmol/L (ref 22–32)
Calcium: 9.5 mg/dL (ref 8.9–10.3)
Chloride: 103 mmol/L (ref 98–111)
Creatinine, Ser: 1.2 mg/dL (ref 0.61–1.24)
GFR, Estimated: >60 mL/min (ref >60)
Glucose, Bld: 103 mg/dL — ABNORMAL HIGH (ref 70–99)
Potassium: 4.1 mmol/L (ref 3.5–5.1)
Sodium: 138 mmol/L (ref 135–145)

## 2020-10-11 LAB — CBC WITH DIFFERENTIAL/PLATELET
Abs Immature Granulocytes: 0.02 10*3/uL (ref 0.00–0.07)
Basophils Absolute: 0 10*3/uL (ref 0.0–0.1)
Basophils Relative: 0 %
Eosinophils Absolute: 0.2 10*3/uL (ref 0.0–0.5)
Eosinophils Relative: 2 %
HCT: 41.9 % (ref 39.0–52.0)
Hemoglobin: 13.6 g/dL (ref 13.0–17.0)
Immature Granulocytes: 0 %
Lymphocytes Relative: 39 %
Lymphs Abs: 2.7 10*3/uL (ref 0.7–4.0)
MCH: 31.6 pg (ref 26.0–34.0)
MCHC: 32.5 g/dL (ref 30.0–36.0)
MCV: 97.4 fL (ref 80.0–100.0)
Monocytes Absolute: 0.4 10*3/uL (ref 0.1–1.0)
Monocytes Relative: 6 %
Neutro Abs: 3.6 10*3/uL (ref 1.7–7.7)
Neutrophils Relative %: 53 %
Platelets: 283 10*3/uL (ref 150–400)
RBC: 4.3 MIL/uL (ref 4.22–5.81)
RDW: 13.4 % (ref 11.5–15.5)
WBC: 6.9 10*3/uL (ref 4.0–10.5)
nRBC: 0 % (ref 0.0–0.2)

## 2020-10-11 LAB — URINALYSIS, ROUTINE W REFLEX MICROSCOPIC
Bilirubin Urine: NEGATIVE
Glucose, UA: NEGATIVE mg/dL
Hgb urine dipstick: NEGATIVE
Ketones, ur: 20 mg/dL — AB
Nitrite: NEGATIVE
Protein, ur: NEGATIVE mg/dL
Specific Gravity, Urine: 1.017 (ref 1.005–1.030)
pH: 5 (ref 5.0–8.0)

## 2020-10-11 LAB — SURGICAL PCR SCREEN
MRSA, PCR: NEGATIVE
Staphylococcus aureus: NEGATIVE

## 2020-10-11 LAB — GLUCOSE, CAPILLARY: Glucose-Capillary: 99 mg/dL (ref 70–99)

## 2020-10-11 LAB — PROTIME-INR
INR: 1.1 (ref 0.8–1.2)
Prothrombin Time: 13.3 seconds (ref 11.4–15.2)

## 2020-10-11 LAB — APTT: aPTT: 29 seconds (ref 24–36)

## 2020-10-11 NOTE — Progress Notes (Addendum)
Anesthesia Review:  PCP: DR Nerstrand 09/01/2020  To see again for followuop on 10/16/2020  Cardiologist : Chest x-ray : 05/2020  EKG :05/2020  Echo : Stress test: Cardiac Cath :  Activity level: can do ao flight of stairs without difficulty  Sleep Study/ CPAP : no  Fasting Blood Sugar :      / Checks Blood Sugar -- times a day:   Blood Thinner/ Instructions /Last Dose: ASA / Instructions/ Last Dose :  CBG checks occasionally  U/A done 10/11/2020 faxed to Dr Mayer Camel.

## 2020-10-12 ENCOUNTER — Other Ambulatory Visit (HOSPITAL_COMMUNITY)
Admission: RE | Admit: 2020-10-12 | Discharge: 2020-10-12 | Disposition: A | Discharge status: Home / Self Care | Payer: BC Managed Care – PPO | Source: Ambulatory Visit | Attending: Orthopedic Surgery | Admitting: Orthopedic Surgery

## 2020-10-12 DIAGNOSIS — Z20822 Contact with and (suspected) exposure to covid-19: Secondary | ICD-10-CM | POA: Diagnosis not present

## 2020-10-12 DIAGNOSIS — Z01812 Encounter for preprocedural laboratory examination: Secondary | ICD-10-CM | POA: Diagnosis not present

## 2020-10-12 LAB — SARS CORONAVIRUS 2 (TAT 6-24 HRS): SARS Coronavirus 2: NEGATIVE

## 2020-10-13 ENCOUNTER — Ambulatory Visit: Payer: BC Managed Care – PPO | Admitting: Internal Medicine

## 2020-10-13 ENCOUNTER — Other Ambulatory Visit: Payer: Self-pay

## 2020-10-13 VITALS — BP 140/86 | HR 87 | Temp 98.7°F | Ht 71.0 in | Wt 225.5 lb

## 2020-10-13 DIAGNOSIS — M109 Gout, unspecified: Secondary | ICD-10-CM | POA: Diagnosis not present

## 2020-10-13 DIAGNOSIS — I10 Essential (primary) hypertension: Secondary | ICD-10-CM

## 2020-10-13 MED ORDER — AMLODIPINE BESYLATE 5 MG PO TABS: 5.0000 mg | ORAL_TABLET | Freq: Every day | ORAL | 1 refills | Status: DC

## 2020-10-13 MED ORDER — COLCHICINE 0.6 MG PO TABS: 0.6000 mg | ORAL_TABLET | Freq: Two times a day (BID) | ORAL | 0 refills | Status: DC

## 2020-10-13 NOTE — Progress Notes (Signed)
Established Patient Office Visit     This visit occurred during the SARS-CoV-2 public health emergency.  Safety protocols were in place, including screening questions prior to the visit, additional usage of staff PPE, and extensive cleaning of exam room while observing appropriate contact time as indicated for disinfecting solutions.    CC/Reason for Visit: Blood pressure follow-up, discuss acute gout  HPI: Bill [name] is a 54 y.o. male who is coming in today for the above mentioned reasons.  He was seen at the end of October at which time his blood pressure was noted to still be elevated.  His lisinopril was increased from 20 to 40 mg and he is here today for follow-up.  He has been unable to do ambulatory blood pressure monitoring.  He tells me he is scheduled for his knee replacement surgery next week.  He has been dealing with an acute gout flare.  This is on his left big toe.  He is requesting a cortisone refill.   Past Medical/Surgical History: Past Medical History:  Diagnosis Date  . Arthritis    left leg  . Diabetes mellitus without complication ([place])    TYPE 2   . GERD (gastroesophageal reflux disease)   . Gout   . Hypertension     Past Surgical History:  Procedure Laterality Date  . DG OPERATIVE RIGHT HIP ([place] HX)      Social History:  reports that he has never smoked. He has never used smokeless tobacco. He reports previous alcohol use. He reports that he does not use drugs.  Allergies: Allergies  Allergen Reactions  . Dog Epithelium     Dog hair and dander    Family History:  Family History  Problem Relation Age of Onset  . Diabetes Father   . Colon cancer Neg Hx   . Esophageal cancer Neg Hx   . Rectal cancer Neg Hx   . Stomach cancer Neg Hx      Current Outpatient Medications:  .  acetaminophen (TYLENOL) 500 MG tablet, Take 500 mg by mouth every 6 (six) hours as needed for moderate pain or headache., Disp: , Rfl:  .  allopurinol (ZYLOPRIM)  100 MG tablet, Take 1 tablet (100 mg total) by mouth daily., Disp: 90 tablet, Rfl: 1 .  Continuous Blood Gluc Receiver (FREESTYLE LIBRE 2 READER) DEVI, 1 each by Does not apply route daily., Disp: 1 each, Rfl: 2 .  Continuous Blood Gluc Sensor (FREESTYLE LIBRE 2 SENSOR) MISC, 1 each by Does not apply route daily., Disp: 1 each, Rfl: 12 .  lisinopril (ZESTRIL) 40 MG tablet, Take 1 tablet (40 mg total) by mouth daily., Disp: 90 tablet, Rfl: 1 .  metFORMIN (GLUCOPHAGE) 1000 MG tablet, Take 0.5 tablets (500 mg total) by mouth daily with breakfast., Disp: 90 tablet, Rfl: 0 .  pantoprazole (PROTONIX) 40 MG tablet, TAKE 1 TABLET BY MOUTH EVERY DAY (Patient taking differently: Take 40 mg by mouth daily.), Disp: 90 tablet, Rfl: 1 .  amLODipine (NORVASC) 5 MG tablet, Take 1 tablet (5 mg total) by mouth daily., Disp: 90 tablet, Rfl: 1 .  colchicine 0.6 MG tablet, Take 1 tablet (0.6 mg total) by mouth 2 (two) times daily., Disp: 40 tablet, Rfl: 0  Review of Systems:  Constitutional: Denies fever, chills, diaphoresis, appetite change and fatigue.  HEENT: Denies photophobia, eye pain, redness, hearing loss, ear pain, congestion, sore throat, rhinorrhea, sneezing, mouth sores, trouble swallowing, neck pain, neck stiffness and tinnitus.  Respiratory: Denies SOB, DOE, cough, chest tightness,  and wheezing.   Cardiovascular: Denies chest pain, palpitations and leg swelling.  Gastrointestinal: Denies nausea, vomiting, abdominal pain, diarrhea, constipation, blood in stool and abdominal distention.  Genitourinary: Denies dysuria, urgency, frequency, hematuria, flank pain and difficulty urinating.  Endocrine: Denies: hot or cold intolerance, sweats, changes in hair or nails, polyuria, polydipsia. Musculoskeletal: Denies myalgias, back pain. Skin: Denies pallor, rash and wound.  Neurological: Denies dizziness, seizures, syncope, weakness, light-headedness, numbness and headaches.  Hematological: Denies adenopathy.  Easy bruising, personal or family bleeding history  Psychiatric/Behavioral: Denies suicidal ideation, mood changes, confusion, nervousness, sleep disturbance and agitation    Physical Exam: Vitals:   10/13/20 1001  BP: 140/86  Pulse: 87  Temp: 98.7 F (37.1 C)  TempSrc: Oral  SpO2: 98%  Weight: 225 lb 8 oz (102.3 kg)  Height: 5\' 11"  (1.803 m)    Body mass index is 31.45 kg/m.   Constitutional: NAD, calm, comfortable Eyes: PERRL, lids and conjunctivae normal ENMT: Mucous membranes are moist.  Respiratory: clear to auscultation bilaterally, no wheezing, no crackles. Normal respiratory effort. No accessory muscle use.  Cardiovascular: Regular rate and rhythm, no murmurs / rubs / gallops. No extremity edema. Neurologic: Grossly intact and nonfocal Psychiatric: Normal judgment and insight. Alert and oriented x 3. Normal mood.    Impression and Plan:  Primary hypertension  -Remains uncontrolled on maximum dose of lisinopril. -I will add amlodipine 5 mg daily. -He will return in 6 to 8 weeks for follow-up.  Acute gout involving toe of left foot, unspecified cause  - Plan: colchicine 0.6 MG tablet that he may take twice daily as needed.  He knows to contact us if his issues persist.   Patient Instructions  -Nice seeing you today!!  -Start amlodipine (norvasc) 5 mg daily.  -May use colchicine 0.6 mg twice daily as needed for gout pain.  -Schedule follow up in 6-8 weeks for your blood pressure.     [name], MD [facility]

## 2020-10-13 NOTE — Patient Instructions (Signed)
-  Nice seeing you today!!  -Start amlodipine (norvasc) 5 mg daily.  -May use colchicine 0.6 mg twice daily as needed for gout pain.  -Schedule follow up in 6-8 weeks for your blood pressure.

## 2020-10-15 MED ORDER — TRANEXAMIC ACID 1000 MG/10ML IV SOLN
2000.0000 mg | INTRAVENOUS | Status: DC
  Filled 2020-10-15: qty 20

## 2020-10-15 MED ORDER — BUPIVACAINE LIPOSOME 1.3 % IJ SUSP
10.0000 mL | Freq: Once | INTRAMUSCULAR | Status: DC
  Filled 2020-10-15: qty 10

## 2020-10-15 NOTE — Anesthesia Preprocedure Evaluation (Addendum)
Anesthesia Evaluation  Patient identified by MRN, date of Haug, ID band Patient awake    Reviewed: Allergy & Precautions, NPO status , Patient's Chart, lab work & pertinent test results  Airway Mallampati: II  TM Distance: >3 FB     Dental   Pulmonary neg pulmonary ROS,    breath sounds clear to auscultation       Cardiovascular hypertension,  Rhythm:Regular Rate:Normal     Neuro/Psych negative neurological ROS     GI/Hepatic Neg liver ROS, GERD  ,  Endo/Other  diabetes  Renal/GU negative Renal ROS     Musculoskeletal   Abdominal   Peds  Hematology   Anesthesia Other Findings   Reproductive/Obstetrics                            Anesthesia Physical Anesthesia Plan  ASA: III  Anesthesia Plan: Spinal   Post-op Pain Management:    Induction: Intravenous  PONV Risk Score and Plan: 1 and 2 and Ondansetron, Dexamethasone and Propofol infusion  Airway Management Planned: Nasal Cannula and Simple Face Mask  Additional Equipment:   Intra-op Plan:   Post-operative Plan:   Informed Consent:     Dental advisory given  Plan Discussed with: Anesthesiologist and CRNA  Anesthesia Plan Comments:        Anesthesia Quick Evaluation

## 2020-10-16 ENCOUNTER — Encounter (HOSPITAL_COMMUNITY)
Admission: RE | Disposition: A | Discharge status: Home / Self Care | Payer: Self-pay | Source: Home / Self Care | Attending: Orthopedic Surgery

## 2020-10-16 ENCOUNTER — Ambulatory Visit (HOSPITAL_COMMUNITY): Payer: BC Managed Care – PPO | Admitting: Anesthesiology

## 2020-10-16 ENCOUNTER — Observation Stay (HOSPITAL_COMMUNITY)
Admission: RE | Admit: 2020-10-16 | Discharge: 2020-10-17 | Disposition: A | Discharge status: Home / Self Care | Payer: BC Managed Care – PPO | Attending: Orthopedic Surgery | Admitting: Orthopedic Surgery

## 2020-10-16 ENCOUNTER — Encounter (HOSPITAL_COMMUNITY): Payer: Self-pay | Admitting: Orthopedic Surgery

## 2020-10-16 ENCOUNTER — Other Ambulatory Visit: Payer: Self-pay

## 2020-10-16 ENCOUNTER — Ambulatory Visit (HOSPITAL_COMMUNITY): Payer: BC Managed Care – PPO | Admitting: Physician Assistant

## 2020-10-16 DIAGNOSIS — Z7984 Long term (current) use of oral hypoglycemic drugs: Secondary | ICD-10-CM | POA: Insufficient documentation

## 2020-10-16 DIAGNOSIS — M1651 Unilateral post-traumatic osteoarthritis, right hip: Secondary | ICD-10-CM | POA: Diagnosis not present

## 2020-10-16 DIAGNOSIS — E119 Type 2 diabetes mellitus without complications: Secondary | ICD-10-CM | POA: Diagnosis not present

## 2020-10-16 DIAGNOSIS — Z79899 Other long term (current) drug therapy: Secondary | ICD-10-CM | POA: Diagnosis not present

## 2020-10-16 DIAGNOSIS — Z01818 Encounter for other preprocedural examination: Secondary | ICD-10-CM

## 2020-10-16 DIAGNOSIS — M1611 Unilateral primary osteoarthritis, right hip: Secondary | ICD-10-CM | POA: Diagnosis present

## 2020-10-16 DIAGNOSIS — I10 Essential (primary) hypertension: Secondary | ICD-10-CM | POA: Diagnosis not present

## 2020-10-16 DIAGNOSIS — Z96641 Presence of right artificial hip joint: Secondary | ICD-10-CM

## 2020-10-16 DIAGNOSIS — M25551 Pain in right hip: Secondary | ICD-10-CM | POA: Diagnosis present

## 2020-10-16 HISTORY — PX: TOTAL HIP ARTHROPLASTY: SHX124

## 2020-10-16 LAB — GLUCOSE, CAPILLARY
Glucose-Capillary: 106 mg/dL — ABNORMAL HIGH (ref 70–99)
Glucose-Capillary: 100 mg/dL — ABNORMAL HIGH (ref 70–99)
Glucose-Capillary: 161 mg/dL — ABNORMAL HIGH (ref 70–99)
Glucose-Capillary: 167 mg/dL — ABNORMAL HIGH (ref 70–99)

## 2020-10-16 LAB — HEMOGLOBIN A1C
Hgb A1c MFr Bld: 5.5 % (ref 4.8–5.6)
Mean Plasma Glucose: 111.15 mg/dL

## 2020-10-16 LAB — TYPE AND SCREEN
ABO/RH(D): O POS
Antibody Screen: NEGATIVE

## 2020-10-16 SURGERY — ARTHROPLASTY, HIP, TOTAL,POSTERIOR APPROACH
Anesthesia: Spinal | Site: Hip | Laterality: Right

## 2020-10-16 MED ORDER — FENTANYL CITRATE (PF) 100 MCG/2ML IJ SOLN
INTRAMUSCULAR | Status: DC | PRN
  Administered 2020-10-16: 50 ug via INTRAVENOUS

## 2020-10-16 MED ORDER — LACTATED RINGERS IV BOLUS: 250.0000 mL | Freq: Once | INTRAVENOUS | Status: DC

## 2020-10-16 MED ORDER — BUPIVACAINE LIPOSOME 1.3 % IJ SUSP
10.0000 mL | Freq: Once | INTRAMUSCULAR | Status: AC
  Administered 2020-10-16: 11:00:00 20 mL
  Filled 2020-10-16: qty 10

## 2020-10-16 MED ORDER — ORAL CARE MOUTH RINSE: 15.0000 mL | Freq: Once | OROMUCOSAL | Status: AC

## 2020-10-16 MED ORDER — LIDOCAINE HCL (PF) 2 % IJ SOLN
INTRAMUSCULAR | Status: AC
  Filled 2020-10-16: qty 5

## 2020-10-16 MED ORDER — METOCLOPRAMIDE HCL 5 MG PO TABS: 5.0000 mg | ORAL_TABLET | Freq: Three times a day (TID) | ORAL | Status: DC | PRN

## 2020-10-16 MED ORDER — LACTATED RINGERS IV SOLN: INTRAVENOUS | Status: DC

## 2020-10-16 MED ORDER — PROPOFOL 10 MG/ML IV BOLUS
INTRAVENOUS | Status: DC | PRN
Start: 2020-10-16 — End: 2020-10-16
  Administered 2020-10-16 (×2): 10 mg via INTRAVENOUS

## 2020-10-16 MED ORDER — MIDAZOLAM HCL 2 MG/2ML IJ SOLN
INTRAMUSCULAR | Status: DC | PRN
  Administered 2020-10-16: 2 mg via INTRAVENOUS

## 2020-10-16 MED ORDER — SODIUM CHLORIDE 0.9 % IR SOLN
Status: DC | PRN
  Administered 2020-10-16: 1000 mL

## 2020-10-16 MED ORDER — ONDANSETRON HCL 4 MG/2ML IJ SOLN
INTRAMUSCULAR | Status: AC
  Filled 2020-10-16: qty 2

## 2020-10-16 MED ORDER — PHENYLEPHRINE HCL-NACL 10-0.9 MG/250ML-% IV SOLN
INTRAVENOUS | Status: DC | PRN
  Administered 2020-10-16: 30 ug/min via INTRAVENOUS

## 2020-10-16 MED ORDER — TIZANIDINE HCL 2 MG PO CAPS: 4.0000 mg | ORAL_CAPSULE | Freq: Three times a day (TID) | ORAL | 0 refills | Status: DC | PRN

## 2020-10-16 MED ORDER — CEFAZOLIN SODIUM-DEXTROSE 2-4 GM/100ML-% IV SOLN
2.0000 g | INTRAVENOUS | Status: AC
  Administered 2020-10-16: 09:00:00 2 g via INTRAVENOUS
  Filled 2020-10-16: qty 100

## 2020-10-16 MED ORDER — TRANEXAMIC ACID-NACL 1000-0.7 MG/100ML-% IV SOLN
1000.0000 mg | INTRAVENOUS | Status: AC
  Administered 2020-10-16: 09:00:00 1000 mg via INTRAVENOUS
  Filled 2020-10-16: qty 100

## 2020-10-16 MED ORDER — ASPIRIN EC 81 MG PO TBEC: 81.0000 mg | DELAYED_RELEASE_TABLET | Freq: Every day | ORAL | 2 refills | Status: DC

## 2020-10-16 MED ORDER — FLEET ENEMA 7-19 GM/118ML RE ENEM: 1.0000 | ENEMA | Freq: Once | RECTAL | Status: DC | PRN

## 2020-10-16 MED ORDER — AMLODIPINE BESYLATE 5 MG PO TABS
5.0000 mg | ORAL_TABLET | Freq: Every day | ORAL | Status: DC
  Administered 2020-10-16: 13:00:00 5 mg via ORAL
  Filled 2020-10-16: qty 1

## 2020-10-16 MED ORDER — DOCUSATE SODIUM 100 MG PO CAPS
100.0000 mg | ORAL_CAPSULE | Freq: Two times a day (BID) | ORAL | Status: DC
  Administered 2020-10-16: 21:00:00 100 mg via ORAL
  Filled 2020-10-16: qty 1

## 2020-10-16 MED ORDER — PROPOFOL 10 MG/ML IV BOLUS
INTRAVENOUS | Status: AC
  Filled 2020-10-16: qty 20

## 2020-10-16 MED ORDER — PROPOFOL 1000 MG/100ML IV EMUL
INTRAVENOUS | Status: AC
  Filled 2020-10-16: qty 100

## 2020-10-16 MED ORDER — BUPIVACAINE-EPINEPHRINE 0.5% -1:200000 IJ SOLN
INTRAMUSCULAR | Status: AC
  Filled 2020-10-16: qty 1

## 2020-10-16 MED ORDER — BUPIVACAINE-EPINEPHRINE 0.5% -1:200000 IJ SOLN
INTRAMUSCULAR | Status: DC | PRN
  Administered 2020-10-16: 30 mL

## 2020-10-16 MED ORDER — BUPIVACAINE LIPOSOME 1.3 % IJ SUSP: 10.0000 mL | Freq: Once | INTRAMUSCULAR | Status: DC

## 2020-10-16 MED ORDER — ASPIRIN 81 MG PO CHEW
81.0000 mg | CHEWABLE_TABLET | Freq: Two times a day (BID) | ORAL | Status: DC
  Administered 2020-10-16: 21:00:00 81 mg via ORAL
  Filled 2020-10-16: qty 1

## 2020-10-16 MED ORDER — SODIUM CHLORIDE (PF) 0.9 % IJ SOLN
INTRAMUSCULAR | Status: AC
  Filled 2020-10-16: qty 20

## 2020-10-16 MED ORDER — METOCLOPRAMIDE HCL 5 MG/ML IJ SOLN
5.0000 mg | Freq: Three times a day (TID) | INTRAMUSCULAR | Status: DC | PRN
Start: 2020-10-16 — End: 2020-10-17

## 2020-10-16 MED ORDER — INSULIN ASPART 100 UNIT/ML ~~LOC~~ SOLN
0.0000 [IU] | Freq: Three times a day (TID) | SUBCUTANEOUS | Status: DC
  Administered 2020-10-16: 17:00:00 3 [IU] via SUBCUTANEOUS
  Administered 2020-10-17: 08:00:00 2 [IU] via SUBCUTANEOUS

## 2020-10-16 MED ORDER — DEXAMETHASONE SODIUM PHOSPHATE 10 MG/ML IJ SOLN: 10.0000 mg | Freq: Once | INTRAMUSCULAR | Status: DC

## 2020-10-16 MED ORDER — METFORMIN HCL 500 MG PO TABS
500.0000 mg | ORAL_TABLET | Freq: Every day | ORAL | Status: DC
  Administered 2020-10-17: 08:00:00 500 mg via ORAL
  Filled 2020-10-16: qty 1

## 2020-10-16 MED ORDER — ALLOPURINOL 100 MG PO TABS
100.0000 mg | ORAL_TABLET | Freq: Every day | ORAL | Status: DC
  Administered 2020-10-16: 15:00:00 100 mg via ORAL
  Filled 2020-10-16 (×2): qty 1

## 2020-10-16 MED ORDER — LACTATED RINGERS IV BOLUS
500.0000 mL | Freq: Once | INTRAVENOUS | Status: AC
  Administered 2020-10-16: 12:00:00 500 mL via INTRAVENOUS

## 2020-10-16 MED ORDER — ONDANSETRON HCL 4 MG/2ML IJ SOLN
INTRAMUSCULAR | Status: DC | PRN
  Administered 2020-10-16: 4 mg via INTRAVENOUS

## 2020-10-16 MED ORDER — BUPIVACAINE IN DEXTROSE 0.75-8.25 % IT SOLN
INTRATHECAL | Status: DC | PRN
  Administered 2020-10-16: 2 mL via INTRATHECAL

## 2020-10-16 MED ORDER — TRANEXAMIC ACID 1000 MG/10ML IV SOLN
INTRAVENOUS | Status: DC | PRN
  Administered 2020-10-16: 10:00:00 2000 mg via TOPICAL

## 2020-10-16 MED ORDER — PHENOL 1.4 % MT LIQD: 1.0000 | OROMUCOSAL | Status: DC | PRN

## 2020-10-16 MED ORDER — FENTANYL CITRATE (PF) 100 MCG/2ML IJ SOLN
INTRAMUSCULAR | Status: AC
  Filled 2020-10-16: qty 2

## 2020-10-16 MED ORDER — POLYETHYLENE GLYCOL 3350 17 G PO PACK: 17.0000 g | PACK | Freq: Every day | ORAL | Status: DC | PRN

## 2020-10-16 MED ORDER — KCL IN DEXTROSE-NACL 20-5-0.45 MEQ/L-%-% IV SOLN
INTRAVENOUS | Status: DC
  Filled 2020-10-16 (×5): qty 1000

## 2020-10-16 MED ORDER — PHENYLEPHRINE 40 MCG/ML (10ML) SYRINGE FOR IV PUSH (FOR BLOOD PRESSURE SUPPORT)
PREFILLED_SYRINGE | INTRAVENOUS | Status: AC
  Filled 2020-10-16: qty 10

## 2020-10-16 MED ORDER — MENTHOL 3 MG MT LOZG
1.0000 | LOZENGE | OROMUCOSAL | Status: DC | PRN
  Administered 2020-10-17: 01:00:00 3 mg via ORAL
  Filled 2020-10-16: qty 9

## 2020-10-16 MED ORDER — PROPOFOL 500 MG/50ML IV EMUL
INTRAVENOUS | Status: DC | PRN
  Administered 2020-10-16: 135 ug/kg/min via INTRAVENOUS

## 2020-10-16 MED ORDER — DEXAMETHASONE SODIUM PHOSPHATE 10 MG/ML IJ SOLN
INTRAMUSCULAR | Status: DC | PRN
  Administered 2020-10-16: 8 mg via INTRAVENOUS

## 2020-10-16 MED ORDER — PANTOPRAZOLE SODIUM 40 MG PO TBEC
40.0000 mg | DELAYED_RELEASE_TABLET | Freq: Every day | ORAL | Status: DC
  Administered 2020-10-16: 15:00:00 40 mg via ORAL
  Filled 2020-10-16: qty 1

## 2020-10-16 MED ORDER — STERILE WATER FOR IRRIGATION IR SOLN
Status: DC | PRN
Start: 2020-10-16 — End: 2020-10-16
  Administered 2020-10-16: 2000 mL

## 2020-10-16 MED ORDER — CHLORHEXIDINE GLUCONATE 0.12 % MT SOLN
15.0000 mL | Freq: Once | OROMUCOSAL | Status: AC
  Administered 2020-10-16: 07:00:00 15 mL via OROMUCOSAL

## 2020-10-16 MED ORDER — PROPOFOL 500 MG/50ML IV EMUL
INTRAVENOUS | Status: AC
  Filled 2020-10-16: qty 50

## 2020-10-16 MED ORDER — ROCURONIUM BROMIDE 10 MG/ML (PF) SYRINGE
PREFILLED_SYRINGE | INTRAVENOUS | Status: AC
  Filled 2020-10-16: qty 10

## 2020-10-16 MED ORDER — ONDANSETRON HCL 4 MG/2ML IJ SOLN: 4.0000 mg | Freq: Four times a day (QID) | INTRAMUSCULAR | Status: DC | PRN

## 2020-10-16 MED ORDER — SODIUM CHLORIDE 0.9% FLUSH
INTRAVENOUS | Status: DC | PRN
  Administered 2020-10-16: 20 mL

## 2020-10-16 MED ORDER — LISINOPRIL 20 MG PO TABS
40.0000 mg | ORAL_TABLET | Freq: Every day | ORAL | Status: DC
  Administered 2020-10-16: 13:00:00 40 mg via ORAL
  Filled 2020-10-16: qty 2

## 2020-10-16 MED ORDER — HYDROMORPHONE HCL 1 MG/ML IJ SOLN: 0.2500 mg | INTRAMUSCULAR | Status: DC | PRN

## 2020-10-16 MED ORDER — DEXAMETHASONE SODIUM PHOSPHATE 10 MG/ML IJ SOLN
INTRAMUSCULAR | Status: AC
  Filled 2020-10-16: qty 1

## 2020-10-16 MED ORDER — PHENYLEPHRINE HCL (PRESSORS) 10 MG/ML IV SOLN
INTRAVENOUS | Status: AC
  Filled 2020-10-16: qty 1

## 2020-10-16 MED ORDER — OXYCODONE HCL 5 MG PO TABS: 5.0000 mg | ORAL_TABLET | ORAL | 0 refills | Status: DC | PRN

## 2020-10-16 MED ORDER — PHENYLEPHRINE 40 MCG/ML (10ML) SYRINGE FOR IV PUSH (FOR BLOOD PRESSURE SUPPORT)
PREFILLED_SYRINGE | INTRAVENOUS | Status: DC | PRN
  Administered 2020-10-16 (×2): 120 ug via INTRAVENOUS

## 2020-10-16 MED ORDER — POVIDONE-IODINE 10 % EX SWAB
2.0000 "application " | Freq: Once | CUTANEOUS | Status: AC
  Administered 2020-10-16: 2 via TOPICAL

## 2020-10-16 MED ORDER — ONDANSETRON HCL 4 MG PO TABS: 4.0000 mg | ORAL_TABLET | Freq: Four times a day (QID) | ORAL | Status: DC | PRN

## 2020-10-16 MED ORDER — BISACODYL 5 MG PO TBEC: 5.0000 mg | DELAYED_RELEASE_TABLET | Freq: Every day | ORAL | Status: DC | PRN

## 2020-10-16 MED ORDER — ALUM & MAG HYDROXIDE-SIMETH 200-200-20 MG/5ML PO SUSP: 30.0000 mL | ORAL | Status: DC | PRN

## 2020-10-16 MED ORDER — COLCHICINE 0.6 MG PO TABS
0.6000 mg | ORAL_TABLET | Freq: Two times a day (BID) | ORAL | Status: DC
  Filled 2020-10-16: qty 1

## 2020-10-16 MED ORDER — MIDAZOLAM HCL 2 MG/2ML IJ SOLN
INTRAMUSCULAR | Status: AC
  Filled 2020-10-16: qty 2

## 2020-10-16 MED ORDER — DIPHENHYDRAMINE HCL 12.5 MG/5ML PO ELIX: 12.5000 mg | ORAL_SOLUTION | ORAL | Status: DC | PRN

## 2020-10-16 SURGICAL SUPPLY — 59 items
BIT DRILL 2.8X128 (BIT) ×2 IMPLANT
BIT DRILL 2.8X128MM (BIT) ×1
BLADE SAW SGTL 18X1.27X75 (BLADE) ×2 IMPLANT
BLADE SAW SGTL 18X1.27X75MM (BLADE) ×1
BLADE SURG SZ10 CARB STEEL (BLADE) ×6 IMPLANT
BRUSH FEMORAL CANAL (MISCELLANEOUS) IMPLANT
BUR OVAL CARBIDE 4.0 (BURR) IMPLANT
COVER SURGICAL LIGHT HANDLE (MISCELLANEOUS) ×3 IMPLANT
COVER WAND RF STERILE (DRAPES) IMPLANT
CUP ACET PNNCL SECTR W/GRIP 56 (Hips) ×1 IMPLANT
DRAPE ORTHO SPLIT 77X108 STRL (DRAPES)
DRAPE SHEET LG 3/4 BI-LAMINATE (DRAPES) ×6 IMPLANT
DRAPE SURG ORHT 6 SPLT 77X108 (DRAPES) IMPLANT
DRAPE U-SHAPE 47X51 STRL (DRAPES) ×3 IMPLANT
DRESSING AQUACEL AG SP 3.5X10 (GAUZE/BANDAGES/DRESSINGS) ×1 IMPLANT
DRSG AQUACEL AG ADV 3.5X10 (GAUZE/BANDAGES/DRESSINGS) ×3 IMPLANT
DRSG AQUACEL AG SP 3.5X10 (GAUZE/BANDAGES/DRESSINGS) ×3
DURAPREP 26ML APPLICATOR (WOUND CARE) ×3 IMPLANT
ELECT BLADE TIP CTD 4 INCH (ELECTRODE) ×3 IMPLANT
ELECT REM PT RETURN 15FT ADLT (MISCELLANEOUS) ×6 IMPLANT
ELIMINATOR HOLE APEX DEPUY (Hips) IMPLANT
GLOVE BIO SURGEON STRL SZ7.5 (GLOVE) ×3 IMPLANT
GLOVE BIO SURGEON STRL SZ8.5 (GLOVE) ×3 IMPLANT
GLOVE BIOGEL PI IND STRL 9 (GLOVE) ×1 IMPLANT
GLOVE BIOGEL PI INDICATOR 9 (GLOVE) ×2
GOWN STRL REUS W/ TWL XL LVL3 (GOWN DISPOSABLE) ×1 IMPLANT
GOWN STRL REUS W/TWL XL LVL3 (GOWN DISPOSABLE) ×5 IMPLANT
HANDPIECE INTERPULSE COAX TIP (DISPOSABLE)
HEAD FEM DLT TS CER 36X+0 (Hips) ×3 IMPLANT
HOOD PEEL AWAY FLYTE STAYCOOL (MISCELLANEOUS) ×9 IMPLANT
KIT BASIN OR (CUSTOM PROCEDURE TRAY) ×3 IMPLANT
KIT TURNOVER KIT A (KITS) ×3 IMPLANT
LINER NEUTRAL 52MMX36MMX56 (Hips) ×3 IMPLANT
MANIFOLD NEPTUNE II (INSTRUMENTS) ×3 IMPLANT
NEEDLE HYPO 21X1.5 SAFETY (NEEDLE) ×6 IMPLANT
NS IRRIG 1000ML POUR BTL (IV SOLUTION) ×3 IMPLANT
PACK TOTAL JOINT (CUSTOM PROCEDURE TRAY) ×3 IMPLANT
PASSER SUT SWANSON 36MM LOOP (INSTRUMENTS) ×3 IMPLANT
PENCIL SMOKE EVACUATOR (MISCELLANEOUS) IMPLANT
PINN SECTOR W/GRIP ACE CUP 56 (Hips) ×3 IMPLANT
PRESSURIZER FEMORAL UNIV (MISCELLANEOUS) IMPLANT
PROTECTOR NERVE ULNAR (MISCELLANEOUS) ×3 IMPLANT
SET HNDPC FAN SPRY TIP SCT (DISPOSABLE) IMPLANT
SLEEVE FEM PROX 18F LRG (Hips) ×3 IMPLANT
SPONGE LAP 18X18 RF (DISPOSABLE) ×3 IMPLANT
STEM FEM MOD STD 42 13X18X160 (Hips) ×3 IMPLANT
SUT ETHIBOND 2 V 37 (SUTURE) ×3 IMPLANT
SUT VIC AB 0 CTX 27 (SUTURE) ×3 IMPLANT
SUT VIC AB 1 CTX 36 (SUTURE) ×2
SUT VIC AB 1 CTX36XBRD ANBCTR (SUTURE) ×1 IMPLANT
SUT VIC AB 2-0 CT1 27 (SUTURE) ×2
SUT VIC AB 2-0 CT1 TAPERPNT 27 (SUTURE) ×1 IMPLANT
SUT VIC AB 3-0 CT1 27 (SUTURE) ×2
SUT VIC AB 3-0 CT1 TAPERPNT 27 (SUTURE) ×1 IMPLANT
SYR CONTROL 10ML LL (SYRINGE) ×6 IMPLANT
TOWEL OR 17X26 10 PK STRL BLUE (TOWEL DISPOSABLE) ×6 IMPLANT
TOWER CARTRIDGE SMART MIX (DISPOSABLE) IMPLANT
TRAY CATH 16FR W/PLASTIC CATH (SET/KITS/TRAYS/PACK) ×3 IMPLANT
WATER STERILE IRR 1000ML POUR (IV SOLUTION) ×6 IMPLANT

## 2020-10-16 NOTE — Progress Notes (Signed)
Orthopedic Tech Progress Note Patient Details:  Deundre Thong [facility] Sep 19, 1966 [id number]  Ortho Devices Ortho Device/Splint Location: applied overhead frame to bed Ortho Device/Splint Interventions: Ordered,Application   Post Interventions Patient Tolerated: Well Instructions Provided: Care of device   Braulio Bosch 10/16/2020, 3:16 PM

## 2020-10-16 NOTE — Progress Notes (Signed)
Orthopedic Tech Progress Note Patient Details:  Bill [name] [facility] 06/05/1966 [id number]  Ortho Devices Ortho Device/Splint Location: Trapeze bar Ortho Device/Splint Interventions: Application   Post Interventions Patient Tolerated: Well Instructions Provided: Care of device   [name] 10/16/2020, 1:30 PM

## 2020-10-16 NOTE — Anesthesia Postprocedure Evaluation (Signed)
Anesthesia Post Note  Patient: Bill [name]  Procedure(s) Performed: RIGHT POSTERIOR TOTAL HIP ARTHROPLASTY AND REMOVAL OF HARDWARE (Right Hip)     Patient location during evaluation: PACU Anesthesia Type: Spinal Level of consciousness: awake Pain management: pain level controlled Respiratory status: spontaneous breathing Cardiovascular status: stable Postop Assessment: no apparent nausea or vomiting Anesthetic complications: no   No complications documented.  Last Vitals:  Vitals:   10/16/20 1422 10/16/20 1436  BP: (!) 169/93 (!) 170/88  Pulse: 61 (!) 50  Resp:  15  Temp:    SpO2:  99%    Last Pain:  Vitals:   10/16/20 1436  TempSrc:   PainSc: 0-No pain                 Novah Nessel

## 2020-10-16 NOTE — Evaluation (Signed)
Physical Therapy Evaluation Patient Details Name: Bill [name] MRN: [id number] DOB: [birth year] Today's Date: 10/16/2020   History of Present Illness  Patient is 54 y.o. male s/p Rt THR (hardware removal and conversion to posterior THA) on 10/16/20 with PMH significant for HTN, GERD, OA, DM, gout.    Clinical Impression  Bill [name] is a 54 y.o. male POD 0 s/p Rt THR. Patient reports independence with mobility at baseline. Patient is now limited by functional impairments (see PT problem list below) and requires min assist for transfers and gait with RW. Patient was able to ambulate ~65 feet with RW and in assist. Patient educated on posterior hip precautions and instructed in exercise to facilitate ROM and circulation. Patient will benefit from continued skilled PT interventions to address impairments and progress towards PLOF. Acute PT will follow to progress mobility and stair training in preparation for safe discharge home.     Follow Up Recommendations Follow surgeon's recommendation for DC plan and follow-up therapies;Home health PT    Equipment Recommendations  Rolling walker with 5" wheels;3in1 (PT)    Recommendations for Other Services       Precautions / Restrictions Precautions Precautions: Fall;Posterior Hip Precaution Booklet Issued: Yes (comment) Restrictions Weight Bearing Restrictions: No Other Position/Activity Restrictions: WBAT      Mobility  Bed Mobility Overal bed mobility: Needs Assistance Bed Mobility: Supine to Sit     Supine to sit: Min assist;HOB elevated     General bed mobility comments: cues to maintain posterior hip precautions, assist for Rt LE.    Transfers Overall transfer level: Needs assistance Equipment used: Rolling walker (2 wheeled) Transfers: Sit to/from Stand Sit to Stand: Min assist;From elevated surface         General transfer comment: cues for technique with RW and to maintain posterior hip precautions. assist from  elevated EOB. pt stable/steady in standing.  Ambulation/Gait Ambulation/Gait assistance: Min assist Gait Distance (Feet): 65 Feet Assistive device: Rolling walker (2 wheeled) Gait Pattern/deviations: Step-to pattern;Decreased stride length Gait velocity: decr   General Gait Details: cues for step pattern/proximity to RW, p with tendency to step too far into walker, improved throughout with cues.  Stairs            Wheelchair Mobility    Modified Rankin (Stroke Patients Only)       Balance Overall balance assessment: Needs assistance Sitting-balance support: Feet supported Sitting balance-Leahy Scale: Good     Standing balance support: During functional activity;Bilateral upper extremity supported Standing balance-Leahy Scale: Fair                               Pertinent Vitals/Pain Pain Assessment: 0-10 Pain Score: 4  Pain Location: Rt hip Pain Descriptors / Indicators: Aching;Discomfort Pain Intervention(s): Limited activity within patient's tolerance;Monitored during session;Repositioned;Ice applied    Home Living Family/patient expects to be discharged to:: Private residence Living Arrangements: Alone Available Help at Discharge: Family Type of Home: House Home Access: Stairs to enter Entrance Stairs-Rails: Can reach both Entrance Stairs-Number of Steps: 4-5 Home Layout: One level Home Equipment: None      Prior Function Level of Independence: Independent               Hand Dominance        Extremity/Trunk Assessment   Upper Extremity Assessment Upper Extremity Assessment: Overall WFL for tasks assessed    Lower Extremity Assessment Lower Extremity Assessment: Overall WFL for tasks  assessed;RLE deficits/detail RLE Deficits / Details: good quad activation and ankle dorsi/plantar flexion (3/5) RLE Sensation: WNL RLE Coordination: WNL    Cervical / Trunk Assessment Cervical / Trunk Assessment: Normal  Communication    Communication: No difficulties  Cognition Arousal/Alertness: Awake/alert Behavior During Therapy: WFL for tasks assessed/performed Overall Cognitive Status: Within Functional Limits for tasks assessed                                        General Comments      Exercises Total Joint Exercises Ankle Circles/Pumps: AROM;Both;20 reps;Seated   Assessment/Plan    PT Assessment Patient needs continued PT services  PT Problem List Decreased strength;Decreased range of motion;Decreased mobility;Decreased balance;Decreased activity tolerance;Decreased knowledge of use of DME;Decreased knowledge of precautions       PT Treatment Interventions DME instruction;Gait training;Functional mobility training;Therapeutic activities;Stair training;Therapeutic exercise;Balance training;Patient/family education    PT Goals (Current goals can be found in the Care Plan section)  Acute Rehab PT Goals Patient Stated Goal: regain independence PT Goal Formulation: With patient Time For Goal Achievement: 10/23/20 Potential to Achieve Goals: Good    Frequency 7X/week   Barriers to discharge        Co-evaluation               AM-PAC PT "6 Clicks" Mobility  Outcome Measure Help needed turning from your back to your side while in a flat bed without using bedrails?: A Little Help needed moving from lying on your back to sitting on the side of a flat bed without using bedrails?: A Little Help needed moving to and from a bed to a chair (including a wheelchair)?: A Little Help needed standing up from a chair using your arms (e.g., wheelchair or bedside chair)?: A Little Help needed to walk in hospital room?: A Little Help needed climbing 3-5 steps with a railing? : A Little 6 Click Score: 18    End of Session Equipment Utilized During Treatment: Gait belt Activity Tolerance: Patient tolerated treatment well Patient left: in chair;with call bell/phone within reach;with chair alarm  set Nurse Communication: Mobility status PT Visit Diagnosis: Muscle weakness (generalized) (M62.81);Difficulty in walking, not elsewhere classified (R26.2)    Time: 9794-8016 PT Time Calculation (min) (ACUTE ONLY): 18 min   Charges:   PT Evaluation $PT Eval Low Complexity: 1 Low          Verner Mould, DPT Acute Rehabilitation Services  Office [phone] Pager [phone]  10/16/2020 4:53 PM

## 2020-10-16 NOTE — Transfer of Care (Signed)
Immediate Anesthesia Transfer of Care Note  Patient: Bill [name]  Procedure(s) Performed: RIGHT POSTERIOR TOTAL HIP ARTHROPLASTY AND REMOVAL OF HARDWARE (Right Hip)  Patient Location: PACU  Anesthesia Type:Spinal  Level of Consciousness: awake  Airway & Oxygen Therapy: Patient Spontanous Breathing and Patient connected to face mask oxygen  Post-op Assessment: Report given to RN and Post -op Vital signs reviewed and stable  Post vital signs: Reviewed and stable  Last Vitals:  Vitals Value Taken Time  BP 108/71 10/16/20 1149  Temp    Pulse 67 10/16/20 1150  Resp 11 10/16/20 1150  SpO2 100 % 10/16/20 1150  Vitals shown include unvalidated device data.  Last Pain:  Vitals:   10/16/20 0654  TempSrc:   PainSc: 0-No pain      Patients Stated Pain Goal: 4 (19/47/12 5271)  Complications: No complications documented.

## 2020-10-16 NOTE — Anesthesia Procedure Notes (Signed)
Spinal  Patient location during procedure: OR Start time: 10/16/2020 8:53 AM End time: 10/16/2020 8:57 AM Staffing Performed: resident/CRNA  Resident/CRNA: Niel Hummer, CRNA Preanesthetic Checklist Completed: patient identified, IV checked, risks and benefits discussed, surgical consent, monitors and equipment checked and pre-op evaluation Spinal Block Patient position: sitting Prep: DuraPrep Patient monitoring: heart rate, continuous pulse ox and blood pressure Approach: midline Location: L3-4 Injection technique: single-shot Needle Needle type: Pencan  Needle gauge: 24 G Needle length: 9 cm

## 2020-10-16 NOTE — Progress Notes (Signed)
Orthopedic Tech Progress Note Patient Details:  Jermon Chalfant [facility] [birth year] [id number] Double charge for overhead frame. Patient ID: Graysen Woodyard Nilan, male   DOB: April 13, 1966, 54 y.o.   MRN: [id number]   Braulio Bosch 10/16/2020, 3:41 PM

## 2020-10-16 NOTE — Op Note (Signed)
PATIENT ID:      Bill [name]  MRN:     [id number] DOB/AGE:    54-Jan-1967 / 54 y.o.       OPERATIVE REPORT    DATE OF PROCEDURE:  10/16/2020       PREOPERATIVE DIAGNOSIS:  RIGHT HIP POST TRAUMATIC OSTEOARTHRITIS                                                       Estimated body mass index is 31.45 kg/m as calculated from the following:   Height as of this encounter: 5\' 11"  (1.803 m).   Weight as of this encounter: 102.3 kg.     POSTOPERATIVE DIAGNOSIS: Same                                                          PROCEDURE:  R total hip arthroplasty using a 56 mm DePuy Pinnacle  Cup, [employer], 10-degree polyethylene liner index superior  and posterior, a +0 36 mm ceramic head, a 18x13x150x42 SROM Stem, 18FL Sleeve.  We also removed a 3.5 millimeter screw used to fix his pelvic acetabular fracture.  SURGEON: Kerin Salen    ASSISTANT:   Kerry Hough. [employer]  (present throughout entire procedure and necessary for timely completion of the procedure)  ANESTHESIA: spinal  BLOOD LOSS: 400cc FLUID REPLACEMENT: 1600 crystalloid Tranexamic Acid: 1gm IV, 2gm Topical Exparel: 10cc DRAINS: None COMPLICATIONS: None    INDICATIONS FOR PROCEDURE:Patient with end-stage arthritis of the right hip.  X-rays show bone-on-bone arthritic changes, peri chondral cyts. Despite conservative measures with observation, anti-inflammatory medicine, narcotics, use of a cane, has severe unremitting pain and can ambulate only 1 blocks before resting. Patient desires elective right total hip arthroplasty to decrease pain and increase function. The risks, benefits, and alternatives were discussed at length including but not limited to the risks of infection, bleeding, nerve injury, stiffness, blood clots, the need for revision surgery, cardiopulmonary complications, among others, and they were willing to proceed.Benefits have been discussed. Questions answered.     PROCEDURE IN DETAIL: The patient  was identified by armband,  received preoperative IV antibiotics in the holding area at [facility], taken to the operating room , appropriate anesthetic monitors  were attached and general endotracheal anesthesia induced. Foley catheter was inserted. Patient was rolled into the left lateral decubitus position and fixed there with a Stulberg Mark II pelvic clamp and the right lower extremity was then prepped and draped  in the usual sterile fashion from the ankle to the hemipelvis. A time-out  procedure was performed. Kerry Hough. Hardin Negus [facility] was present and scrubbed throughout the case, critical for assistance with, positioning, exposure, retraction, instrumentation, and closure.The skin along the lateral hip and thigh  infiltrated with 10 mL of 0.5% Marcaine and epinephrine solution. We  then made a posterolateral approach to the hip. With a #10 blade, 15 cm  incision through skin and subcutaneous tissue down to the level of the  IT band. Small bleeders were identified and cauterized. IT band cut in  line with skin incision exposing the greater trochanter. A Cobra retractor  was placed between the gluteus minimus and the superior hip joint capsule, and a spiked Cobra between the quadratus femoris and the inferior hip joint capsule. This isolated the short  external rotators and piriformis tendons. These were tagged with a #2 Ethibond  suture and cut off their insertion on the intertrochanteric crest. The posterior  capsule was then developed into an acetabular-based flap from Posterior Superior off of the acetabulum out over the femoral neck and back posterior inferior to the acetabular rim. This flap was tagged with two #2 Ethibond sutures and retracted protecting the sciatic nerve. This exposed the arthritic femoral head and osteophytes. The hip was then flexed and internally rotated, dislocating the femoral head and a standard neck cut performed 1 fingerbreadth above the lesser trochanter.  A  spiked Cobra was placed in the cotyloid notch and a Hohmann retractor was then used to lever the femur anteriorly off of the anterior pelvic column. A posterior-inferior wing retractor was placed at the junction of the acetabulum and the ischium completing the acetabular exposure.We then removed the peripheral osteophytes and labrum from the acetabulum.  We did see one of the 3.5 millimeter screws in the bone of the superior acetabulum and this was removed with small osteotomes and a set of pliers.  We then reamed the acetabulum up to 55 mm with basket reamers obtaining good coverage in all quadrants, irrigated out with normal  saline solution and hammered into place a 56 mm pinnacle cup in 45  degrees of abduction and about 20 degrees of anteversion. More  peripheral osteophytes removed, the apex hole eliminator was placed, and a 10-degree liner placed with the  Index posterior and superior. The hip was then flexed and internally rotated exposing the  proximal femur, which was entered with the box cutting chisel, the initiating reamer followed by axial reaming up to 13.5 mm full depth, and 14 partial depth. We then conically reamed up to 18 F. And milled the calcar to 18 F large. We then placed the trial sleeve, and a trial stem. A trial reduction was then  performed with a +0  36-mm ball on the standard neck and  excellent stability was noted with at 90 of flexion with 60 of  internal rotation and then full extension withexternal rotation. The hip  could not be dislocated in full extension. The knee could easily flex  to about 140 degrees. We also stretched the abductors at this point,  because of the preexisting adductor contractures. All trial components  were then removed. The real sleeve was then hammered into place, through the sleeve we reamed with a 13.5 mm reamer to ensure that the stem did not become incarcerated. The stem itself was then inserted in 0 degrees in relation to the calcar.  At  this point, a + 0 36-mm ceramic head was  hammered on the stem. The hip was reduced. We checked our stability  one more time and found to be excellent. The wound was once again  thoroughly irrigated out with normal saline solution pulse lavage. The  capsular flap and short external rotators were repaired back to the  intertrochanteric crest through drill holes with a #2 Ethibond suture.  The IT band was closed with running 1 Vicryl suture. The subcutaneous  tissue with 0 and 2-0 undyed Vicryl suture and the skin with running  3-0 Vivryl SQ suture. Aquacil dessing was applied. The patient was then unclamped, rolled supine, awaken extubated and taken to recovery room without  difficulty in stable condition.   Kerin Salen 10/16/2020, 7:09 AM

## 2020-10-16 NOTE — Anesthesia Procedure Notes (Signed)
Procedure Name: MAC Date/Time: 10/16/2020 8:51 AM Performed by: Niel Hummer, CRNA Pre-anesthesia Checklist: Patient identified, Emergency Drugs available, Suction available and Patient being monitored Oxygen Delivery Method: Simple face mask

## 2020-10-16 NOTE — Discharge Instructions (Signed)

## 2020-10-16 NOTE — Interval H&P Note (Signed)
History and Physical Interval Note:  10/16/2020 7:08 AM  Bill [name]  has presented today for surgery, with the diagnosis of RIGHT HIP POST TRAUMATIC OSTEOARTHRITIS.  The various methods of treatment have been discussed with the patient and family. After consideration of risks, benefits and other options for treatment, the patient has consented to  Procedure(s): RIGHT POSTERIOR TOTAL HIP ARTHROPLASTY AND REMOVAL OF HARDWARE (Right) as a surgical intervention.  The patient's history has been reviewed, patient examined, no change in status, stable for surgery.  I have reviewed the patient's chart and labs.  Questions were answered to the patient's satisfaction.     Kerin Salen

## 2020-10-17 ENCOUNTER — Encounter (HOSPITAL_COMMUNITY): Payer: Self-pay | Admitting: Orthopedic Surgery

## 2020-10-17 DIAGNOSIS — M1651 Unilateral post-traumatic osteoarthritis, right hip: Secondary | ICD-10-CM | POA: Diagnosis not present

## 2020-10-17 LAB — BASIC METABOLIC PANEL
Anion gap: 12 (ref 5–15)
BUN: 19 mg/dL (ref 6–20)
CO2: 21 mmol/L — ABNORMAL LOW (ref 22–32)
Calcium: 9 mg/dL (ref 8.9–10.3)
Chloride: 103 mmol/L (ref 98–111)
Creatinine, Ser: 1.36 mg/dL — ABNORMAL HIGH (ref 0.61–1.24)
GFR, Estimated: >60 mL/min (ref >60)
Glucose, Bld: 164 mg/dL — ABNORMAL HIGH (ref 70–99)
Potassium: 4.1 mmol/L (ref 3.5–5.1)
Sodium: 136 mmol/L (ref 135–145)

## 2020-10-17 LAB — CBC
HCT: 36 % — ABNORMAL LOW (ref 39.0–52.0)
Hemoglobin: 12 g/dL — ABNORMAL LOW (ref 13.0–17.0)
MCH: 31.7 pg (ref 26.0–34.0)
MCHC: 33.3 g/dL (ref 30.0–36.0)
MCV: 95 fL (ref 80.0–100.0)
Platelets: 312 10*3/uL (ref 150–400)
RBC: 3.79 MIL/uL — ABNORMAL LOW (ref 4.22–5.81)
RDW: 13.1 % (ref 11.5–15.5)
WBC: 9.8 10*3/uL (ref 4.0–10.5)
nRBC: 0 % (ref 0.0–0.2)

## 2020-10-17 LAB — GLUCOSE, CAPILLARY
Glucose-Capillary: 124 mg/dL — ABNORMAL HIGH (ref 70–99)
Glucose-Capillary: 125 mg/dL — ABNORMAL HIGH (ref 70–99)

## 2020-10-17 MED ORDER — HYDROMORPHONE HCL 2 MG PO TABS
2.0000 mg | ORAL_TABLET | ORAL | Status: DC | PRN
  Administered 2020-10-17: 2 mg via ORAL
  Filled 2020-10-17: qty 1

## 2020-10-17 MED ORDER — TIZANIDINE HCL 2 MG PO CAPS: 4.0000 mg | ORAL_CAPSULE | Freq: Three times a day (TID) | ORAL | 0 refills | Status: DC | PRN

## 2020-10-17 MED ORDER — ASPIRIN EC 81 MG PO TBEC: 81.0000 mg | DELAYED_RELEASE_TABLET | Freq: Every day | ORAL | 2 refills | Status: AC

## 2020-10-17 MED ORDER — OXYCODONE HCL 5 MG PO TABS: 5.0000 mg | ORAL_TABLET | ORAL | 0 refills | Status: AC | PRN

## 2020-10-17 NOTE — Discharge Summary (Signed)
Patient ID: Bill [name] MRN: [id number] DOB/AGE: 06-27-1966 54 y.o.  Admit date: 10/16/2020 Discharge date: 10/17/2020  Admission Diagnoses:  Principal Problem:   Osteoarthritis of right hip Active Problems:   Status post total hip replacement, right   Discharge Diagnoses:  Same  Past Medical History:  Diagnosis Date  . Arthritis    left leg  . Diabetes mellitus without complication ([place])    TYPE 2   . GERD (gastroesophageal reflux disease)   . Gout   . Hypertension     Surgeries: Procedure(s): RIGHT POSTERIOR TOTAL HIP ARTHROPLASTY AND REMOVAL OF HARDWARE on 10/16/2020   Consultants:   Discharged Condition: Improved  Hospital Course: Bill [name] is an 54 y.o. male who was admitted 10/16/2020 for operative treatment ofOsteoarthritis of right hip. Patient has severe unremitting pain that affects sleep, daily activities, and work/hobbies. After pre-op clearance the patient was taken to the operating room on 10/16/2020 and underwent  Procedure(s): RIGHT POSTERIOR TOTAL HIP ARTHROPLASTY AND REMOVAL OF HARDWARE.    Patient was given perioperative antibiotics:  Anti-infectives (From admission, onward)   Start     Dose/Rate Route Frequency Ordered Stop   10/16/20 0645  ceFAZolin (ANCEF) IVPB 2g/100 mL premix        2 g 200 mL/hr over 30 Minutes Intravenous On call to O.R. 10/16/20 2263 10/16/20 0929       Patient was given sequential compression devices, early ambulation, and chemoprophylaxis to prevent DVT.  Patient benefited maximally from hospital stay and there were no complications.    Recent vital signs:  Patient Vitals for the past 24 hrs:  BP Temp Temp src Pulse Resp SpO2  10/17/20 0530 139/82 98.6 F (37 C) Oral 65 16 100 %  10/17/20 0151 122/77 97.9 F (36.6 C) no documentation 82 15 98 %  10/16/20 2011 (Abnormal) 153/91 99 F (37.2 C) Oral 73 17 100 %  10/16/20 1738 (Abnormal) 164/85 98.4 F (36.9 C) Oral 63 18 100 %  10/16/20 1635 (Abnormal)  161/80 97.8 F (36.6 C) Oral (Abnormal) 58 18 100 %  10/16/20 1536 (Abnormal) 171/83 (Abnormal) 97.5 F (36.4 C) Oral (Abnormal) 48 17 94 %  10/16/20 1436 (Abnormal) 170/88 no documentation no documentation (Abnormal) 50 15 99 %  10/16/20 1422 (Abnormal) 169/93 no documentation no documentation 61 no documentation no documentation  10/16/20 1348 no documentation 97.6 F (36.4 C) no documentation no documentation no documentation no documentation  10/16/20 1338 no documentation no documentation no documentation 76 16 100 %  10/16/20 1304 (Abnormal) 179/102 no documentation no documentation (Abnormal) 104 10 100 %  10/16/20 1239 no documentation no documentation no documentation 72 15 100 %  10/16/20 1224 no documentation no documentation no documentation 72 (Abnormal) 7 100 %  10/16/20 1215 (Abnormal) 142/93 no documentation no documentation no documentation 10 100 %  10/16/20 1149 no documentation 97.6 F (36.4 C) no documentation no documentation no documentation no documentation     Recent laboratory studies:  Recent Labs    10/17/20 0307  WBC 9.8  HGB 12.0*  HCT 36.0*  PLT 312  NA 136  K 4.1  CL 103  CO2 21*  BUN 19  CREATININE 1.36*  GLUCOSE 164*  CALCIUM 9.0     Discharge Medications:   Allergies as of 10/17/2020    Allergen Reactions Comments   Dog Epithelium  Dog hair and dander      Medication List    Take these medications   acetaminophen 500 MG tablet Commonly  known as: TYLENOL Take 500 mg by mouth every 6 (six) hours as needed for moderate pain or headache.   allopurinol 100 MG tablet Commonly known as: ZYLOPRIM Take 1 tablet (100 mg total) by mouth daily.   amLODipine 5 MG tablet Commonly known as: NORVASC Take 1 tablet (5 mg total) by mouth daily.   aspirin EC 81 MG tablet Take 1 tablet (81 mg total) by mouth daily.   colchicine 0.6 MG tablet Take 1 tablet (0.6 mg total) by mouth 2 (two) times daily.   FreeStyle Libre 2 Reader [place] 1 each  by Does not apply route daily.   FreeStyle Libre 2 Sensor Misc 1 each by Does not apply route daily.   lisinopril 40 MG tablet Commonly known as: ZESTRIL Take 1 tablet (40 mg total) by mouth daily.   metFORMIN 1000 MG tablet Commonly known as: GLUCOPHAGE Take 0.5 tablets (500 mg total) by mouth daily with breakfast.   oxyCODONE 5 MG immediate release tablet Commonly known as: Roxicodone Take 1-2 tablets (5-10 mg total) by mouth every 4 (four) hours as needed for up to 7 days for severe pain.   pantoprazole 40 MG tablet Commonly known as: PROTONIX TAKE 1 TABLET BY MOUTH EVERY DAY   tizanidine 2 MG capsule Commonly known as: Zanaflex Take 2 capsules (4 mg total) by mouth 3 (three) times daily as needed for muscle spasms.        Durable Medical Equipment  (From admission, onward)         Start     Ordered   10/16/20 1138  DME Walker rolling  Once       Question:  Patient needs a walker to treat with the following condition  Answer:  Status post right hip replacement   10/16/20 1137   10/16/20 1138  DME 3 n 1  Once        10/16/20 1137           Discharge Care Instructions  (From admission, onward)         Start     Ordered   10/17/20 0000  Change dressing       Comments: Change dressing Only if drainage exceeds 40% of window on dressing   10/17/20 0726   10/16/20 0000  Change dressing       Comments: Change dressing Only if drainage exceeds 40% of window on dressing   10/16/20 1111          Diagnostic Studies: No results found.  Disposition: Discharge disposition: 01-Home or Self Care       Discharge Instructions    Call MD / Call 911   Complete by: As directed    If you experience chest pain or shortness of breath, CALL 911 and be transported to the hospital emergency room.  If you develope a fever above 101 F, pus (white drainage) or increased drainage or redness at the wound, or calf pain, call your surgeon's office.   Call MD / Call 911    Complete by: As directed    If you experience chest pain or shortness of breath, CALL 911 and be transported to the hospital emergency room.  If you develope a fever above 101 F, pus (white drainage) or increased drainage or redness at the wound, or calf pain, call your surgeon's office.   Change dressing   Complete by: As directed    Change dressing Only if drainage exceeds 40% of window on dressing   Change  dressing   Complete by: As directed    Change dressing Only if drainage exceeds 40% of window on dressing   Constipation Prevention   Complete by: As directed    Drink plenty of fluids.  Prune juice may be helpful.  You may use a stool softener, such as Colace (over the counter) 100 mg twice a day.  Use MiraLax (over the counter) for constipation as needed.   Constipation Prevention   Complete by: As directed    Drink plenty of fluids.  Prune juice may be helpful.  You may use a stool softener, such as Colace (over the counter) 100 mg twice a day.  Use MiraLax (over the counter) for constipation as needed.   Diet - low sodium heart healthy   Complete by: As directed    Increase activity slowly as tolerated   Complete by: As directed    Increase activity slowly as tolerated   Complete by: As directed        Follow-up Information    Frederik Pear, MD In 2 weeks.   Specialty: Orthopedic Surgery Contact information: [place] [address] [phone]                Signed: Kerin Salen 10/17/2020, 7:27 AM

## 2020-10-17 NOTE — Progress Notes (Signed)
Physical Therapy Treatment Patient Details Name: Bill [name] MRN: [id number] DOB: Feb 16, 1966 Today's Date: 10/17/2020    History of Present Illness Patient is 54 y.o. male s/p Rt THR (hardware removal and conversion to posterior THA) on 10/16/20 with PMH significant for HTN, GERD, OA, DM, gout.    PT Comments    POD # 1 am session Assisted OOB to amb in hallway.  General bed mobility comments: demonstarted and instructed how to use belt loop to self assist LE off bed.  General Gait Details: < 25% VC's on proper walker to self distance and safety with turns.  Tolerated an increased distance.  "Tight and sore" but tolerable. General Gait Details: < 25% VC's on proper walker to self distance and safety with turns.  Tolerated an increased distance.  "Tight and sore" but tolerable. General stair comments: 25% VC's on proper sequencing and safety.  Performed well up and down steps with 2 reachable rails.Then returned to room to perform some TE's following HEP handout.  Instructed on proper tech, freq as well as use of ICE.  Addressed all mobility questions, discussed appropriate activity, re educated on THP, educated on use of ICE.  Pt ready for D/C to home.   Follow Up Recommendations  Follow surgeons recommendation for DC plan and follow-up therapies;Home health PT     Equipment Recommendations  Rolling walker with 5" wheels;3in1 (PT) (pt declining 3:1 due to cost)    Recommendations for Other Services       Precautions / Restrictions Precautions Precautions: Fall;Posterior Hip Precaution Booklet Issued: Yes (comment) Precaution Comments: pt able to recall 2/3 THP re educated and issued handout as well as reviwed Restrictions Weight Bearing Restrictions: No Other Position/Activity Restrictions: WBAT    Mobility  Bed Mobility Overal bed mobility: Needs Assistance Bed Mobility: Supine to Sit     Supine to sit: Supervision;Min guard     General bed mobility comments:  demonstarted and instructed how to use belt loop to self assist LE off bed  Transfers Overall transfer level: Needs assistance Equipment used: Rolling walker (2 wheeled) Transfers: Sit to/from [facility] Sit to Stand: Supervision Stand pivot transfers: Supervision       General transfer comment: 25% VC's on proper hand placement and safety with turns as well as increased time  Ambulation/Gait Ambulation/Gait assistance: Supervision;Min guard Gait Distance (Feet): 145 Feet Assistive device: Rolling walker (2 wheeled) Gait Pattern/deviations: Step-to pattern;Decreased stride length Gait velocity: decr   General Gait Details: < 25% VC's on proper walker to self distance and safety with turns.  Tolerated an increased distance.  "Tight and sore" but tolerable.   Stairs Stairs: Yes Stairs assistance: Supervision Stair Management: Two rails;Step to pattern;Forwards Number of Stairs: 2 General stair comments: 25% VC's on proper sequencing and safety.  Performed well up and down steps with 2 reachable rails.   Wheelchair Mobility    Modified Rankin (Stroke Patients Only)       Balance                                            Cognition Arousal/Alertness: Awake/alert Behavior During Therapy: WFL for tasks assessed/performed Overall Cognitive Status: Within Functional Limits for tasks assessed  General Comments: AxO x 3 very motivated      Exercises   Total Hip Replacement TE's following HEP Handout 10 reps ankle pumps 05 reps knee presses 05 reps heel slides 05 reps SAQ's 05 reps ABD 05 reps LAQ's 05 reps all standing TE's  Instructed how to use a belt loop to assist  Followed by ICE     General Comments        Pertinent Vitals/Pain Pain Assessment: 0-10 Pain Score: 3  Pain Location: Rt hip Pain Descriptors / Indicators: Aching;Discomfort;Tightness Pain Intervention(s):  Monitored during session;Premedicated before session;Repositioned;Ice applied    Home Living                      Prior Function            PT Goals (current goals can now be found in the care plan section) Progress towards PT goals: Progressing toward goals    Frequency    7X/week      PT Plan Current plan remains appropriate    Co-evaluation              AM-PAC PT "6 Clicks" Mobility   Outcome Measure  Help needed turning from your back to your side while in a flat bed without using bedrails?: None Help needed moving from lying on your back to sitting on the side of a flat bed without using bedrails?: None Help needed moving to and from a bed to a chair (including a wheelchair)?: None Help needed standing up from a chair using your arms (e.g., wheelchair or bedside chair)?: None Help needed to walk in hospital room?: None Help needed climbing 3-5 steps with a railing? : A Little 6 Click Score: 23    End of Session Equipment Utilized During Treatment: Gait belt Activity Tolerance: Patient tolerated treatment well Patient left: in chair;with call bell/phone within reach;with chair alarm set Nurse Communication: Mobility status (pt ready for D/C after just one PT session) PT Visit Diagnosis: Muscle weakness (generalized) (M62.81);Difficulty in walking, not elsewhere classified (R26.2)     Time: 8466-5993 PT Time Calculation (min) (ACUTE ONLY): 25 min  Charges:  $Gait Training: 8-22 mins $Therapeutic Exercise: 8-22 mins                     Rica Koyanagi  PTA Acute  Rehabilitation Services Pager      [phone] Office      [phone]

## 2020-10-17 NOTE — Progress Notes (Signed)
PATIENT ID: Bill [name]  MRN: [id number]  DOB/AGE:  Sep 19, 1966 / 54 y.o.  1 Day Post-Op Procedure(s) (LRB): RIGHT POSTERIOR TOTAL HIP ARTHROPLASTY AND REMOVAL OF HARDWARE (Right)    PROGRESS NOTE Subjective: Patient is alert, oriented, no Nausea, no Vomiting, yes passing gas, . Taking PO well. Denies SOB, Chest or Calf Pain. Using Incentive Spirometer, PAS in place. Ambulate 55' Patient reports pain as  2/10  .    Objective: Vital signs in last 24 hours: Vitals:   10/16/20 1738 10/16/20 2011 10/17/20 0151 10/17/20 0530  BP: (Abnormal) 164/85 (Abnormal) 153/91 122/77 139/82  Pulse: 63 73 82 65  Resp: 18 17 15 16   Temp: 98.4 F (36.9 C) 99 F (37.2 C) 97.9 F (36.6 C) 98.6 F (37 C)  TempSrc: Oral Oral  Oral  SpO2: 100% 100% 98% 100%  Weight:      Height:          Intake/Output from previous day: I/O last 3 completed shifts: In: 4081 [P.O.:690; I.V.:2780; IV Piggyback:700] Out: 2950 [Urine:2800; Blood:150]   Intake/Output this shift: No intake/output data recorded.   LABORATORY DATA: Recent Labs    10/16/20 1211 10/16/20 1641 10/16/20 2121 10/17/20 0307  WBC  --   --   --  9.8  HGB  --   --   --  12.0*  HCT  --   --   --  36.0*  PLT  --   --   --  312  NA  --   --   --  136  K  --   --   --  4.1  CL  --   --   --  103  CO2  --   --   --  21*  BUN  --   --   --  19  CREATININE  --   --   --  1.36*  GLUCOSE  --   --   --  164*  GLUCAP 100* 161* 167*  --   CALCIUM  --   --   --  9.0    Examination: Neurologically intact ABD soft Neurovascular intact Sensation intact distally Intact pulses distally Dorsiflexion/Plantar flexion intact Incision: dressing C/D/I No cellulitis present Compartment soft}   Assessment:   1 Day Post-Op Procedure(s) (LRB): RIGHT POSTERIOR TOTAL HIP ARTHROPLASTY AND REMOVAL OF HARDWARE (Right) ADDITIONAL DIAGNOSIS:  Expected Acute Blood Loss Anemia, Diabetes  Patient's anticipated LOS is less than 2 midnights, meeting these  requirements: - Younger than 77 - Lives within 1 hour of care - Has a competent adult at home to recover with post-op recover - NO history of  - Chronic pain requiring opiods  - Diabetes  - Coronary Artery Disease  - Heart failure  - Heart attack  - Stroke  - DVT/VTE  - Cardiac arrhythmia  - Respiratory Failure/COPD  - Renal failure  - Anemia  - Advanced Liver disease       Plan: PT/OT WBAT, THA  DVT Prophylaxis: SCDx72 hrs, ASA 81 mg BID x 2 weeks  DISCHARGE PLAN: Home, Today if he passes physical therapy  DISCHARGE NEEDS: HHPT, Walker and 3-in-1 comode seatPatient ID: Bill [name], male   DOB: 07/15/1966, 54 y.o.   MRN: [id number]

## 2020-10-17 NOTE — TOC Transition Note (Signed)
Transition of Care (TOC) - CM/SW Discharge Note   Patient Details  Name: Bill [name] MRN: [id number] Date of [place]: 04/24/1966  Transition of Care (TOC) CM/SW Contact:  HOYLE, LUCY, LCSW Phone Number: 10/17/2020, 10:40 AM   Clinical Narrative:     Met briefly with pt and confirmed receipt of rolling walker.  Plan for HHPT via [facility].  No TOC needs.  Final next level of care: Home w Home Health Services Barriers to Discharge: No Barriers Identified   Patient Goals and CMS Choice Patient states their goals for this hospitalization and ongoing recovery are:: go home      Discharge Placement                       Discharge Plan and Services                DME Arranged: Walker rolling DME Agency: [facility] (pre-arranged prior to surgery)       HH Arranged: PT HH Agency: [facility] (prearranged prior to surgery)        Social Determinants of Health (SDOH) Interventions     Readmission Risk Interventions No flowsheet data found.     

## 2020-10-19 IMAGING — CT CT HIP*R* W/O CM
2 of 3 series · 16 of 46 positions shown, 18 images · non-contrast
Comparison: CT scan from 2052 and hip radiographs from 12/21/2019

CLINICAL DATA: Chronic right hip pain. History of remote acetabular
fractures and fixating hardware.

EXAM:
CT OF THE RIGHT HIP WITHOUT CONTRAST
TECHNIQUE: Multidetector CT imaging of the right hip was performed according to
the standard protocol. Multiplanar CT image reconstructions were
also generated.

[Series 3: hip 2.00 br40 s3 hip axial soft tissue · axial · 0.53mm/px · z∈[+1397,+1575]mm · 13 of 103 slices shown, 15 images]
[im 7/103  soft-tissue]
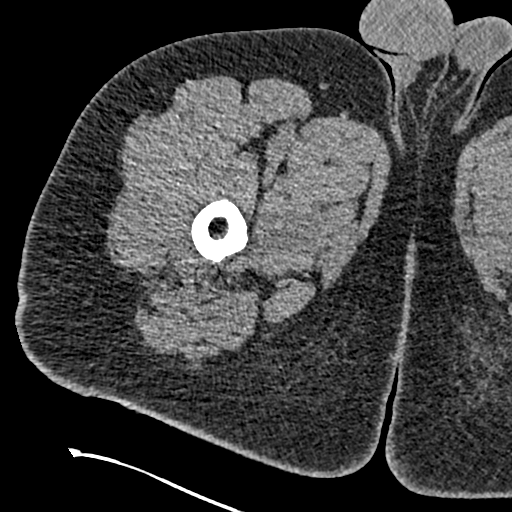
[im 7/103  bone]
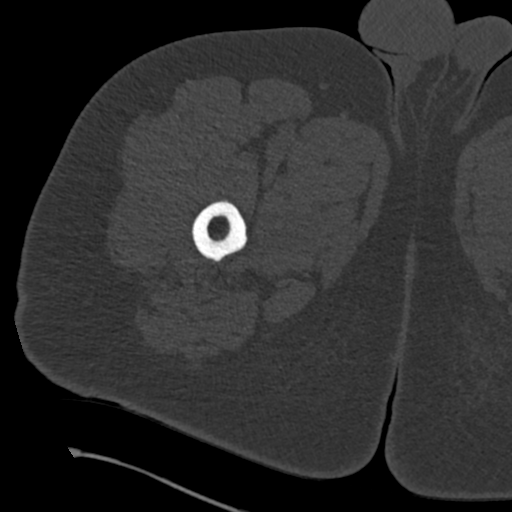
[im 14/103  soft-tissue]
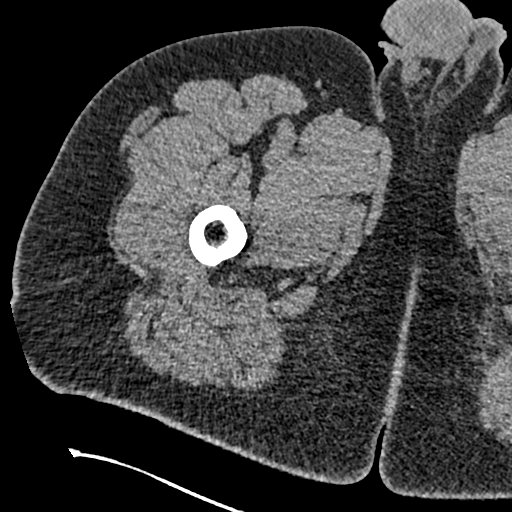
[im 20/103  soft-tissue]
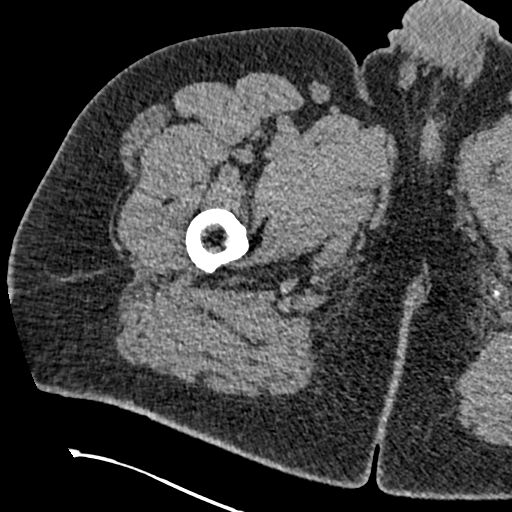
[im 30/103  soft-tissue]
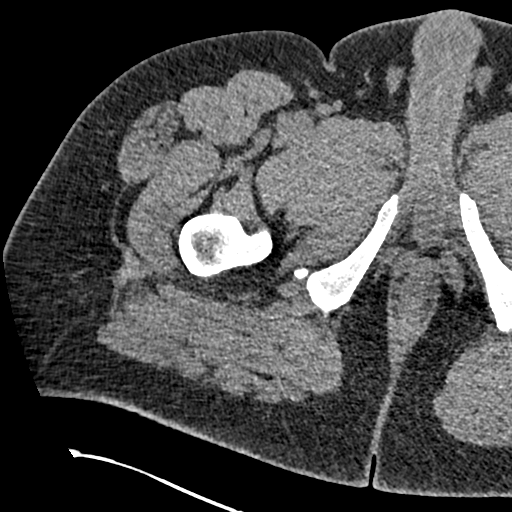
[im 37/103  soft-tissue]
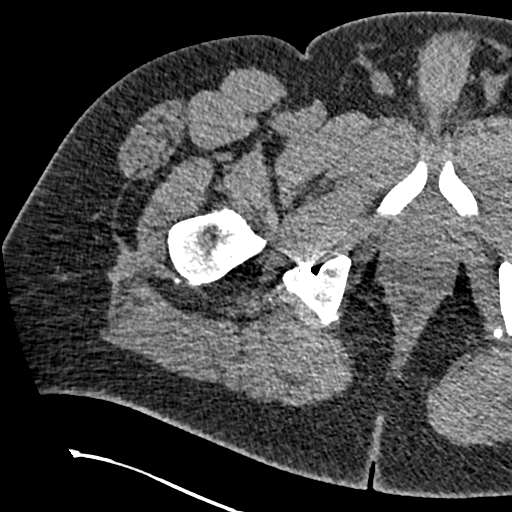
[im 43/103  soft-tissue]
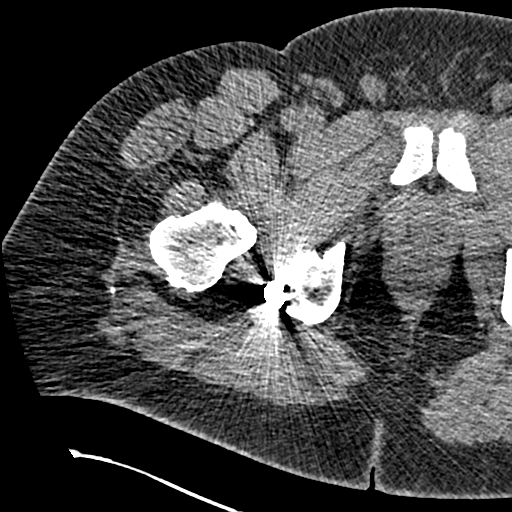
[im 53/103  soft-tissue]
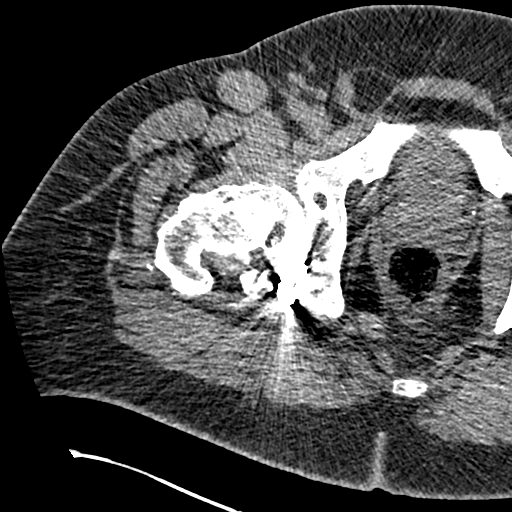
[im 60/103  soft-tissue]
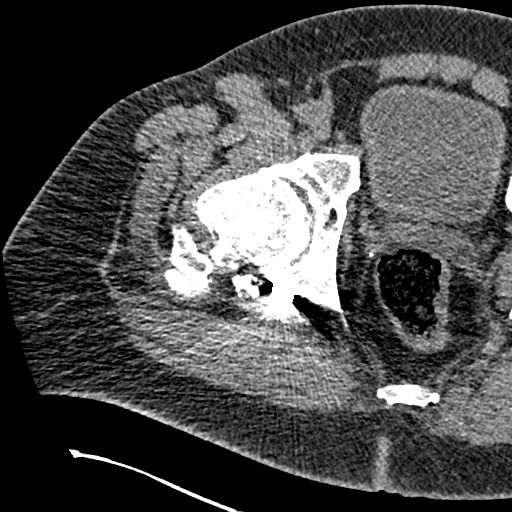
[im 66/103  soft-tissue]
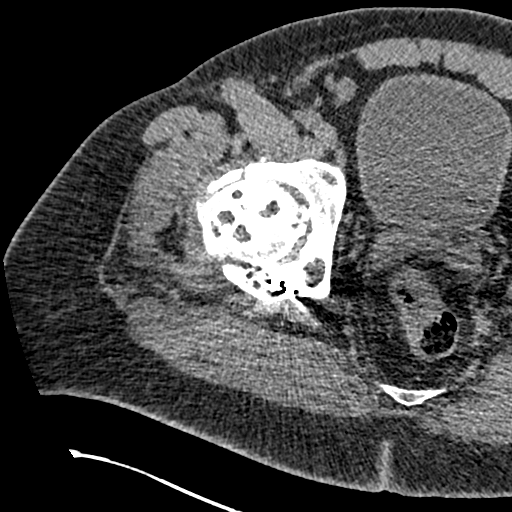
[im 66/103  bone]
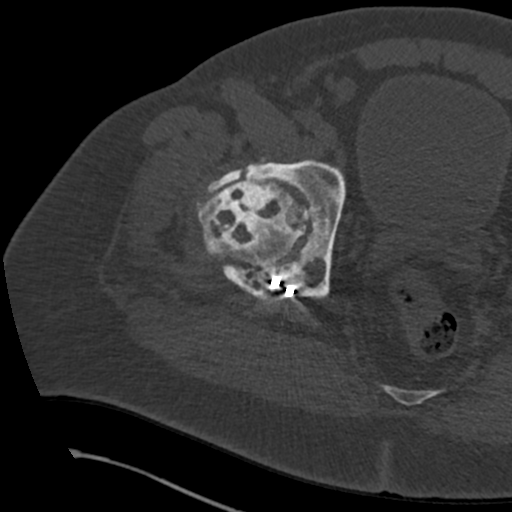
[im 73/103  soft-tissue]
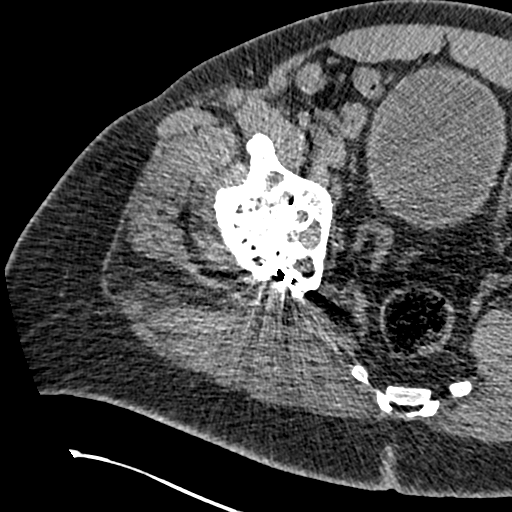
[im 83/103  soft-tissue]
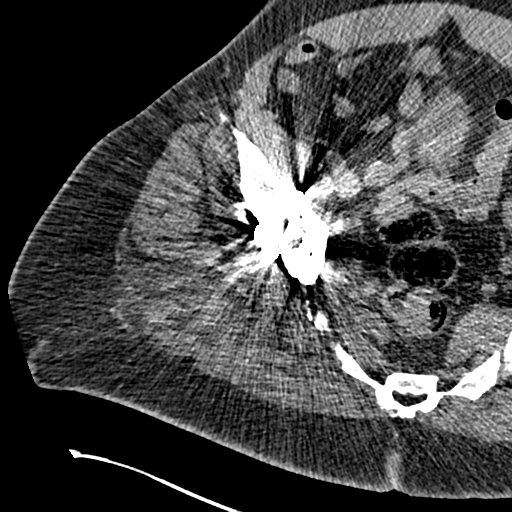
[im 89/103  soft-tissue]
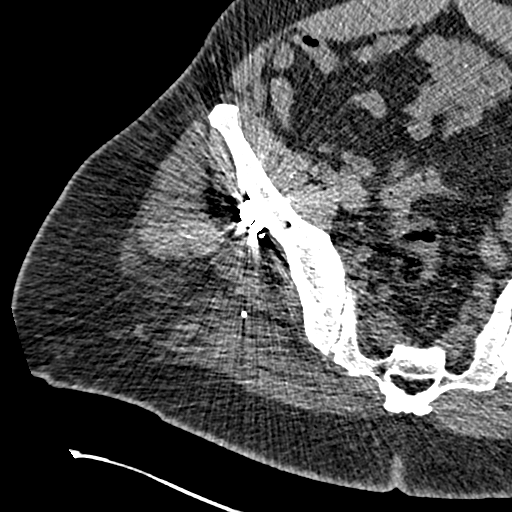
[im 96/103  soft-tissue]
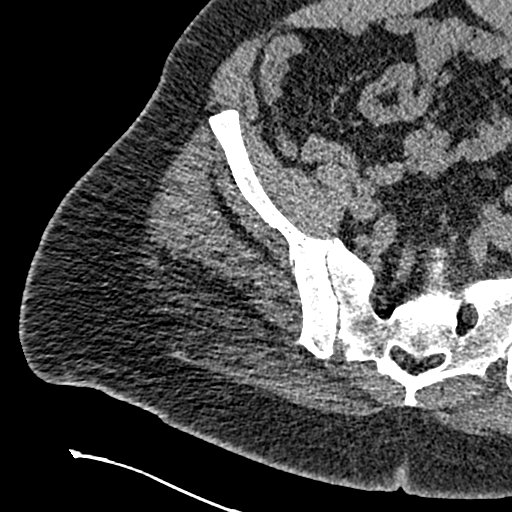

[Series 8: hip 2.00 br40 s3 cor soft · coronal · 0.41mm/px · 3 of 135 slices shown]
[im 45/135  soft-tissue]
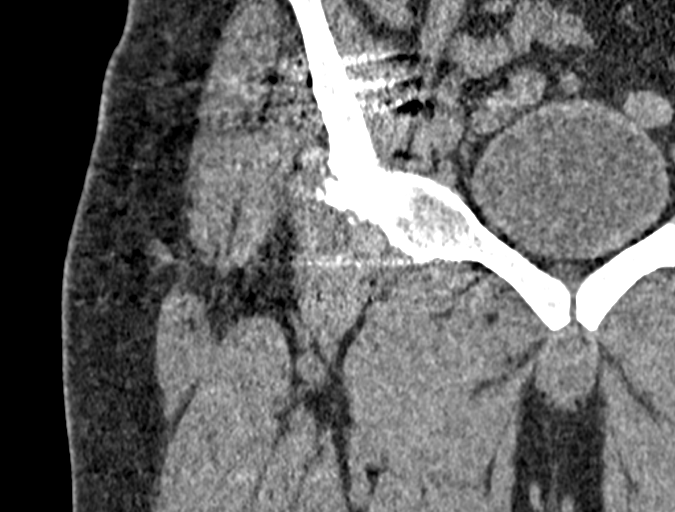
[im 60/135  soft-tissue]
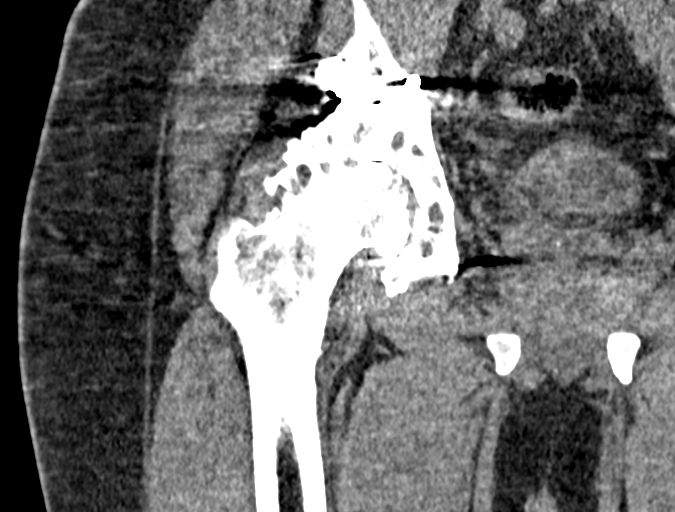
[im 75/135  soft-tissue]
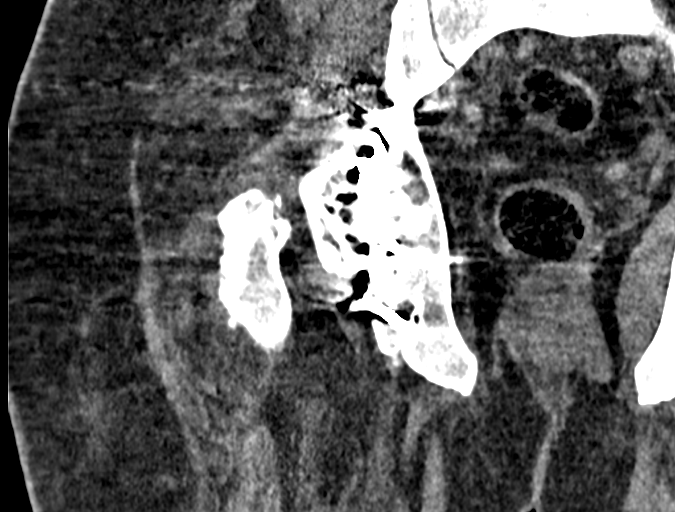

[16 of 46 positions shown; findings below may reference images not displayed]

FINDINGS: Stable fixating acetabular hardware related to prior acetabular
fractures. The hip hardware is intact.

One of the fixating screws is running right along the articular
surface of the acetabulum and could be partly in the joint.

Severe right hip joint degenerative changes with full-thickness
cartilage loss, severe joint space narrowing, osteophytic spurring
and large subchondral cysts. Findings were also present back in 2052
but are progressive. This is most likely posttraumatic arthritis. A
chronic inflammatory arthropathy such as gout is possible.

The pubic symphysis is intact. No chondrocalcinosis. Right SI joint
degenerative changes are noted. No erosive changes.

The surrounding right hip musculature is grossly normal. No obvious
muscle tear or intramuscular hematoma. No inguinal mass or
adenopathy or hernia.

No significant intrapelvic abnormalities are identified.
IMPRESSION: 1. Stable fixating acetabular hardware related to prior acetabular
fractures. One of the fixating screws could potentially be partially
in the joint.
2. Severe right hip joint degenerative changes, most likely
posttraumatic arthritis. A chronic inflammatory arthropathy such as
gout is also possible.
3. No significant intrapelvic abnormalities.

## 2020-11-24 ENCOUNTER — Ambulatory Visit: Payer: BC Managed Care – PPO | Admitting: Internal Medicine

## 2020-12-08 ENCOUNTER — Other Ambulatory Visit: Payer: Self-pay

## 2020-12-08 ENCOUNTER — Encounter: Payer: Self-pay | Admitting: Internal Medicine

## 2020-12-08 ENCOUNTER — Ambulatory Visit: Payer: BC Managed Care – PPO | Admitting: Internal Medicine

## 2020-12-08 VITALS — BP 140/80 | HR 92 | Temp 98.3°F | Wt 219.1 lb

## 2020-12-08 DIAGNOSIS — E1165 Type 2 diabetes mellitus with hyperglycemia: Secondary | ICD-10-CM

## 2020-12-08 DIAGNOSIS — I10 Essential (primary) hypertension: Secondary | ICD-10-CM

## 2020-12-08 DIAGNOSIS — E785 Hyperlipidemia, unspecified: Secondary | ICD-10-CM | POA: Diagnosis not present

## 2020-12-08 DIAGNOSIS — Z96641 Presence of right artificial hip joint: Secondary | ICD-10-CM

## 2020-12-08 DIAGNOSIS — M1A0720 Idiopathic chronic gout, left ankle and foot, without tophus (tophi): Secondary | ICD-10-CM

## 2020-12-08 NOTE — Progress Notes (Signed)
Established Patient Office Visit     This visit occurred during the SARS-CoV-2 public health emergency.  Safety protocols were in place, including screening questions prior to the visit, additional usage of staff PPE, and extensive cleaning of exam room while observing appropriate contact time as indicated for disinfecting solutions.    CC/Reason for Visit: Follow-up chronic conditions  HPI: Bill [name] is a 55 y.o. male who is coming in today for the above mentioned reasons.  He had his hip replaced in December, he has recovered well, continues to lose weight, he has only taking half a tablet of Metformin once a week.  We have been having some issues with his blood pressure after we had to take him off of hydrochlorothiazide due to repeated gout flareups.  He is currently on lisinopril 40 mg and was started on amlodipine 5 mg in December.  He is here today for follow-up.   Past Medical/Surgical History: Past Medical History:  Diagnosis Date  . Arthritis    left leg  . Diabetes mellitus without complication ([place])    TYPE 2   . GERD (gastroesophageal reflux disease)   . Gout   . Hypertension     Past Surgical History:  Procedure Laterality Date  . DG OPERATIVE RIGHT HIP ([place] HX)    . TOTAL HIP ARTHROPLASTY Right 10/16/2020   Procedure: RIGHT POSTERIOR TOTAL HIP ARTHROPLASTY AND REMOVAL OF HARDWARE;  Surgeon: Frederik Pear, MD;  Location: [facility];  Service: Orthopedics;  Laterality: Right;    Social History:  reports that he has never smoked. He has never used smokeless tobacco. He reports previous alcohol use. He reports that he does not use drugs.  Allergies: Allergies  Allergen Reactions  . Dog Epithelium     Dog hair and dander    Family History:  Family History  Problem Relation Age of Onset  . Diabetes Father   . Colon cancer Neg Hx   . Esophageal cancer Neg Hx   . Rectal cancer Neg Hx   . Stomach cancer Neg Hx      Current Outpatient Medications:  .   acetaminophen (TYLENOL) 500 MG tablet, Take 500 mg by mouth every 6 (six) hours as needed for moderate pain or headache., Disp: , Rfl:  .  allopurinol (ZYLOPRIM) 100 MG tablet, Take 1 tablet (100 mg total) by mouth daily., Disp: 90 tablet, Rfl: 1 .  amLODipine (NORVASC) 5 MG tablet, Take 1 tablet (5 mg total) by mouth daily., Disp: 90 tablet, Rfl: 1 .  aspirin EC 81 MG tablet, Take 1 tablet (81 mg total) by mouth daily., Disp: 30 tablet, Rfl: 2 .  colchicine 0.6 MG tablet, Take 1 tablet (0.6 mg total) by mouth 2 (two) times daily., Disp: 40 tablet, Rfl: 0 .  Continuous Blood Gluc Receiver (FREESTYLE LIBRE 2 READER) DEVI, 1 each by Does not apply route daily., Disp: 1 each, Rfl: 2 .  Continuous Blood Gluc Sensor (FREESTYLE LIBRE 2 SENSOR) MISC, 1 each by Does not apply route daily., Disp: 1 each, Rfl: 12 .  lisinopril (ZESTRIL) 40 MG tablet, Take 1 tablet (40 mg total) by mouth daily., Disp: 90 tablet, Rfl: 1 .  pantoprazole (PROTONIX) 40 MG tablet, TAKE 1 TABLET BY MOUTH EVERY DAY (Patient taking differently: Take 40 mg by mouth daily.), Disp: 90 tablet, Rfl: 1 .  tizanidine (ZANAFLEX) 2 MG capsule, Take 2 capsules (4 mg total) by mouth 3 (three) times daily as needed for muscle spasms.,  Disp: 60 capsule, Rfl: 0  Review of Systems:  Constitutional: Denies fever, chills, diaphoresis, appetite change and fatigue.  HEENT: Denies photophobia, eye pain, redness, hearing loss, ear pain, congestion, sore throat, rhinorrhea, sneezing, mouth sores, trouble swallowing, neck pain, neck stiffness and tinnitus.   Respiratory: Denies SOB, DOE, cough, chest tightness,  and wheezing.   Cardiovascular: Denies chest pain, palpitations and leg swelling.  Gastrointestinal: Denies nausea, vomiting, abdominal pain, diarrhea, constipation, blood in stool and abdominal distention.  Genitourinary: Denies dysuria, urgency, frequency, hematuria, flank pain and difficulty urinating.  Endocrine: Denies: hot or cold  intolerance, sweats, changes in hair or nails, polyuria, polydipsia. Musculoskeletal: Denies myalgias, back pain, joint swelling, arthralgias and gait problem.  Skin: Denies pallor, rash and wound.  Neurological: Denies dizziness, seizures, syncope, weakness, light-headedness, numbness and headaches.  Hematological: Denies adenopathy. Easy bruising, personal or family bleeding history  Psychiatric/Behavioral: Denies suicidal ideation, mood changes, confusion, nervousness, sleep disturbance and agitation    Physical Exam: Vitals:   12/08/20 1341  BP: 140/80  Pulse: 92  Temp: 98.3 F (36.8 C)  TempSrc: Oral  SpO2: 98%  Weight: 219 lb 1.6 oz (99.4 kg)    Body mass index is 30.56 kg/m.   Constitutional: NAD, calm, comfortable Eyes: PERRL, lids and conjunctivae normal ENMT: Mucous membranes are moist.  Respiratory: clear to auscultation bilaterally, no wheezing, no crackles. Normal respiratory effort. No accessory muscle use.  Cardiovascular: Regular rate and rhythm, no murmurs / rubs / gallops. No extremity edema.   Neurologic: Grossly intact and nonfocal Psychiatric: Normal judgment and insight. Alert and oriented x 3. Normal mood.    Impression and Plan:  Status post total hip replacement, right -Progressing well.  Uncontrolled type 2 diabetes mellitus with hyperglycemia (HCC) -His A1c today is 5.5 and this is with him only taking half a tablet of Metformin once a week, he will try stopping Metformin altogether and we will recheck A1c in 3 months.  Hyperlipidemia, unspecified hyperlipidemia type -Last LDL was 160, recheck when he returns for physical.  Primary hypertension -Remains uncontrolled, however he admits to some medication nonadherence at least 1 to 2 days a week. -He will set an alarm on his phone to remind him to take medications, he will return in 3 months for follow-up.  Idiopathic chronic gout of left ankle without tophus -No flareups since stopping  hydrochlorothiazide    Maryuri Warnke Isaac Bliss, MD [facility] at [facility]

## 2021-03-06 ENCOUNTER — Other Ambulatory Visit: Payer: Self-pay

## 2021-03-07 ENCOUNTER — Encounter: Payer: Self-pay | Admitting: Internal Medicine

## 2021-03-07 ENCOUNTER — Ambulatory Visit: Payer: BC Managed Care – PPO | Admitting: Internal Medicine

## 2021-03-07 VITALS — BP 120/80 | HR 70 | Temp 98.1°F | Wt 223.5 lb

## 2021-03-07 DIAGNOSIS — M1611 Unilateral primary osteoarthritis, right hip: Secondary | ICD-10-CM

## 2021-03-07 DIAGNOSIS — Z23 Encounter for immunization: Secondary | ICD-10-CM | POA: Diagnosis not present

## 2021-03-07 DIAGNOSIS — E1165 Type 2 diabetes mellitus with hyperglycemia: Secondary | ICD-10-CM | POA: Diagnosis not present

## 2021-03-07 DIAGNOSIS — I10 Essential (primary) hypertension: Secondary | ICD-10-CM

## 2021-03-07 DIAGNOSIS — E785 Hyperlipidemia, unspecified: Secondary | ICD-10-CM

## 2021-03-07 DIAGNOSIS — Z96641 Presence of right artificial hip joint: Secondary | ICD-10-CM

## 2021-03-07 LAB — POCT GLYCOSYLATED HEMOGLOBIN (HGB A1C): Hemoglobin A1C: 5.7 % — AB (ref 4.0–5.6)

## 2021-03-07 NOTE — Addendum Note (Signed)
Addended by: Westley Hummer B on: 03/07/2021 11:42 AM   Modules accepted: Orders

## 2021-03-07 NOTE — Patient Instructions (Signed)
-  Nice seeing you today!!  -Pneumonia vaccine today.  -Remember to get COVID Boosters #3 and #4.  -Schedule your physical in 4 months. Please come in fasting that day.

## 2021-03-07 NOTE — Progress Notes (Signed)
Established Patient Office Visit     This visit occurred during the SARS-CoV-2 public health emergency.  Safety protocols were in place, including screening questions prior to the visit, additional usage of staff PPE, and extensive cleaning of exam room while observing appropriate contact time as indicated for disinfecting solutions.    CC/Reason for Visit: 22-month follow-up chronic medical conditions  HPI: Bill [name] is a 55 y.o. male who is coming in today for the above mentioned reasons. Past Medical History is significant for: Well-controlled hypertension, diabetes type 2 that is well controlled.  He also has a history of gout no flareups since taken off hydrochlorothiazide.  He had right hip osteoarthritis and is now status post a right total hip replacement.  He is significantly improved.  He is overdue for pneumonia vaccination given he is a high risk group with his diabetes.  Other than this he has been feeling well and has no acute complaints.  He is overdue for his third and fourth COVID boosters.   Past Medical/Surgical History: Past Medical History:  Diagnosis Date  . Arthritis    left leg  . Diabetes mellitus without complication ([place])    TYPE 2   . GERD (gastroesophageal reflux disease)   . Gout   . Hypertension     Past Surgical History:  Procedure Laterality Date  . DG OPERATIVE RIGHT HIP ([place] HX)    . TOTAL HIP ARTHROPLASTY Right 10/16/2020   Procedure: RIGHT POSTERIOR TOTAL HIP ARTHROPLASTY AND REMOVAL OF HARDWARE;  Surgeon: Frederik Pear, MD;  Location: [facility];  Service: Orthopedics;  Laterality: Right;    Social History:  reports that he has never smoked. He has never used smokeless tobacco. He reports previous alcohol use. He reports that he does not use drugs.  Allergies: Allergies  Allergen Reactions  . Dog Epithelium     Dog hair and dander    Family History:  Family History  Problem Relation Age of Onset  . Diabetes Father   . Colon  cancer Neg Hx   . Esophageal cancer Neg Hx   . Rectal cancer Neg Hx   . Stomach cancer Neg Hx      Current Outpatient Medications:  .  acetaminophen (TYLENOL) 500 MG tablet, Take 500 mg by mouth every 6 (six) hours as needed for moderate pain or headache., Disp: , Rfl:  .  allopurinol (ZYLOPRIM) 100 MG tablet, Take 1 tablet (100 mg total) by mouth daily., Disp: 90 tablet, Rfl: 1 .  amLODipine (NORVASC) 5 MG tablet, Take 1 tablet (5 mg total) by mouth daily., Disp: 90 tablet, Rfl: 1 .  aspirin EC 81 MG tablet, Take 1 tablet (81 mg total) by mouth daily., Disp: 30 tablet, Rfl: 2 .  colchicine 0.6 MG tablet, Take 1 tablet (0.6 mg total) by mouth 2 (two) times daily., Disp: 40 tablet, Rfl: 0 .  Continuous Blood Gluc Receiver (FREESTYLE LIBRE 2 READER) DEVI, 1 each by Does not apply route daily., Disp: 1 each, Rfl: 2 .  Continuous Blood Gluc Sensor (FREESTYLE LIBRE 2 SENSOR) MISC, 1 each by Does not apply route daily., Disp: 1 each, Rfl: 12 .  lisinopril (ZESTRIL) 40 MG tablet, Take 1 tablet (40 mg total) by mouth daily., Disp: 90 tablet, Rfl: 1 .  pantoprazole (PROTONIX) 40 MG tablet, TAKE 1 TABLET BY MOUTH EVERY DAY (Patient taking differently: Take 40 mg by mouth daily.), Disp: 90 tablet, Rfl: 1 .  tizanidine (ZANAFLEX) 2 MG capsule,  Take 2 capsules (4 mg total) by mouth 3 (three) times daily as needed for muscle spasms., Disp: 60 capsule, Rfl: 0  Review of Systems:  Constitutional: Denies fever, chills, diaphoresis, appetite change and fatigue.  HEENT: Denies photophobia, eye pain, redness, hearing loss, ear pain, congestion, sore throat, rhinorrhea, sneezing, mouth sores, trouble swallowing, neck pain, neck stiffness and tinnitus.   Respiratory: Denies SOB, DOE, cough, chest tightness,  and wheezing.   Cardiovascular: Denies chest pain, palpitations and leg swelling.  Gastrointestinal: Denies nausea, vomiting, abdominal pain, diarrhea, constipation, blood in stool and abdominal distention.   Genitourinary: Denies dysuria, urgency, frequency, hematuria, flank pain and difficulty urinating.  Endocrine: Denies: hot or cold intolerance, sweats, changes in hair or nails, polyuria, polydipsia. Musculoskeletal: Denies myalgias, back pain, joint swelling, arthralgias and gait problem.  Skin: Denies pallor, rash and wound.  Neurological: Denies dizziness, seizures, syncope, weakness, light-headedness, numbness and headaches.  Hematological: Denies adenopathy. Easy bruising, personal or family bleeding history  Psychiatric/Behavioral: Denies suicidal ideation, mood changes, confusion, nervousness, sleep disturbance and agitation    Physical Exam: Vitals:   03/07/21 1106  BP: 120/80  Pulse: 70  Temp: 98.1 F (36.7 C)  TempSrc: Oral  SpO2: 98%  Weight: 223 lb 8 oz (101.4 kg)    Body mass index is 31.17 kg/m.   Constitutional: NAD, calm, comfortable Eyes: PERRL, lids and conjunctivae normal ENMT: Mucous membranes are moist.  Respiratory: clear to auscultation bilaterally, no wheezing, no crackles. Normal respiratory effort. No accessory muscle use.  Cardiovascular: Regular rate and rhythm, no murmurs / rubs / gallops. No extremity edema.  Neurologic: Grossly intact and nonfocal Psychiatric: Normal judgment and insight. Alert and oriented x 3. Normal mood.    Impression and Plan:  Uncontrolled type 2 diabetes mellitus with hyperglycemia ([place]) -Very well controlled with an A1c of 5.7 today. -He is off all medication.  Hyperlipidemia, unspecified hyperlipidemia type -Has not had lipids checked in some time, he will return in 4 months fasting for his physical.  Primary hypertension -Blood pressures well controlled on amlodipine and lisinopril.  Osteoarthritis of right hip, unspecified osteoarthritis type Status post total hip replacement, right -Pain and exercise tolerance completely improved after right hip replacement.  Need for vaccination against Streptococcus  pneumoniae -PCV 20 administered today.   Patient Instructions  -Nice seeing you today!!  -Pneumonia vaccine today.  -Remember to get COVID Boosters #3 and #4.  -Schedule your physical in 4 months. Please come in fasting that day.     [name], MD [facility] ([place])

## 2021-04-11 ENCOUNTER — Other Ambulatory Visit: Payer: Self-pay | Admitting: Internal Medicine

## 2021-04-11 DIAGNOSIS — M109 Gout, unspecified: Secondary | ICD-10-CM

## 2021-05-05 ENCOUNTER — Other Ambulatory Visit: Payer: Self-pay | Admitting: Internal Medicine

## 2021-05-05 DIAGNOSIS — M109 Gout, unspecified: Secondary | ICD-10-CM

## 2021-05-23 ENCOUNTER — Other Ambulatory Visit: Payer: Self-pay | Admitting: Internal Medicine

## 2021-05-23 DIAGNOSIS — M109 Gout, unspecified: Secondary | ICD-10-CM

## 2021-07-10 ENCOUNTER — Encounter: Payer: Self-pay | Admitting: Internal Medicine

## 2021-07-10 ENCOUNTER — Other Ambulatory Visit: Payer: Self-pay | Admitting: Internal Medicine

## 2021-07-10 ENCOUNTER — Ambulatory Visit (INDEPENDENT_AMBULATORY_CARE_PROVIDER_SITE_OTHER): Payer: BC Managed Care – PPO | Admitting: Internal Medicine

## 2021-07-10 ENCOUNTER — Other Ambulatory Visit: Payer: Self-pay

## 2021-07-10 VITALS — BP 130/84 | HR 82 | Temp 98.5°F | Ht 70.5 in | Wt 235.6 lb

## 2021-07-10 DIAGNOSIS — I10 Essential (primary) hypertension: Secondary | ICD-10-CM | POA: Diagnosis not present

## 2021-07-10 DIAGNOSIS — Z23 Encounter for immunization: Secondary | ICD-10-CM | POA: Diagnosis not present

## 2021-07-10 DIAGNOSIS — Z0000 Encounter for general adult medical examination without abnormal findings: Secondary | ICD-10-CM | POA: Diagnosis not present

## 2021-07-10 DIAGNOSIS — E559 Vitamin D deficiency, unspecified: Secondary | ICD-10-CM | POA: Insufficient documentation

## 2021-07-10 DIAGNOSIS — M1A0720 Idiopathic chronic gout, left ankle and foot, without tophus (tophi): Secondary | ICD-10-CM

## 2021-07-10 DIAGNOSIS — E785 Hyperlipidemia, unspecified: Secondary | ICD-10-CM | POA: Diagnosis not present

## 2021-07-10 DIAGNOSIS — E1165 Type 2 diabetes mellitus with hyperglycemia: Secondary | ICD-10-CM | POA: Diagnosis not present

## 2021-07-10 DIAGNOSIS — E782 Mixed hyperlipidemia: Secondary | ICD-10-CM

## 2021-07-10 LAB — COMPREHENSIVE METABOLIC PANEL
ALT: 10 U/L (ref 0–53)
AST: 20 U/L (ref 0–37)
Albumin: 4.1 g/dL (ref 3.5–5.2)
Alkaline Phosphatase: 67 U/L (ref 39–117)
BUN: 20 mg/dL (ref 6–23)
CO2: 25 mEq/L (ref 19–32)
Calcium: 9.5 mg/dL (ref 8.4–10.5)
Chloride: 102 mEq/L (ref 96–112)
Creatinine, Ser: 1.36 mg/dL (ref 0.40–1.50)
GFR: 58.83 mL/min — ABNORMAL LOW (ref >60.00)
Glucose, Bld: 102 mg/dL — ABNORMAL HIGH (ref 70–99)
Potassium: 4.3 mEq/L (ref 3.5–5.1)
Sodium: 137 mEq/L (ref 135–145)
Total Bilirubin: 1.3 mg/dL — ABNORMAL HIGH (ref 0.2–1.2)
Total Protein: 8.2 g/dL (ref 6.0–8.3)

## 2021-07-10 LAB — HEMOGLOBIN A1C: Hgb A1c MFr Bld: 6.1 % (ref 4.6–6.5)

## 2021-07-10 LAB — CBC WITH DIFFERENTIAL/PLATELET
Basophils Absolute: 0 10*3/uL (ref 0.0–0.1)
Basophils Relative: 0.6 % (ref 0.0–3.0)
Eosinophils Absolute: 0.3 10*3/uL (ref 0.0–0.7)
Eosinophils Relative: 5.2 % — ABNORMAL HIGH (ref 0.0–5.0)
HCT: 38.6 % — ABNORMAL LOW (ref 39.0–52.0)
Hemoglobin: 13 g/dL (ref 13.0–17.0)
Lymphocytes Relative: 44.3 % (ref 12.0–46.0)
Lymphs Abs: 2.9 10*3/uL (ref 0.7–4.0)
MCHC: 33.8 g/dL (ref 30.0–36.0)
MCV: 93.8 fl (ref 78.0–100.0)
Monocytes Absolute: 0.3 10*3/uL (ref 0.1–1.0)
Monocytes Relative: 4.7 % (ref 3.0–12.0)
Neutro Abs: 2.9 10*3/uL (ref 1.4–7.7)
Neutrophils Relative %: 45.2 % (ref 43.0–77.0)
Platelets: 277 10*3/uL (ref 150.0–400.0)
RBC: 4.12 Mil/uL — ABNORMAL LOW (ref 4.22–5.81)
RDW: 14 % (ref 11.5–15.5)
WBC: 6.5 10*3/uL (ref 4.0–10.5)

## 2021-07-10 LAB — LIPID PANEL
Cholesterol: 257 mg/dL — ABNORMAL HIGH (ref 0–200)
HDL: 45.5 mg/dL (ref >39.00)
LDL Cholesterol: 185 mg/dL — ABNORMAL HIGH (ref 0–99)
NonHDL: 211.03
Total CHOL/HDL Ratio: 6
Triglycerides: 132 mg/dL (ref 0.0–149.0)
VLDL: 26.4 mg/dL (ref 0.0–40.0)

## 2021-07-10 LAB — VITAMIN D 25 HYDROXY (VIT D DEFICIENCY, FRACTURES): VITD: 20.46 ng/mL — ABNORMAL LOW (ref 30.00–100.00)

## 2021-07-10 LAB — VITAMIN B12: Vitamin B-12: 318 pg/mL (ref 211–911)

## 2021-07-10 LAB — TSH: TSH: 1.98 u[IU]/mL (ref 0.35–5.50)

## 2021-07-10 MED ORDER — ATORVASTATIN CALCIUM 40 MG PO TABS: 40.0000 mg | ORAL_TABLET | Freq: Every day | ORAL | 1 refills | Status: DC

## 2021-07-10 MED ORDER — VITAMIN D (ERGOCALCIFEROL) 1.25 MG (50000 UNIT) PO CAPS: 50000.0000 [IU] | ORAL_CAPSULE | ORAL | 0 refills | Status: DC

## 2021-07-10 NOTE — Patient Instructions (Signed)
-  Nice seeing you today!!  -Lab work today; will notify you once results are available.  -flu, tetanus and shingles vaccines today.  -Schedule follow up in 6 months.

## 2021-07-10 NOTE — Progress Notes (Signed)
Established Patient Office Visit     This visit occurred during the SARS-CoV-2 public health emergency.  Safety protocols were in place, including screening questions prior to the visit, additional usage of staff PPE, and extensive cleaning of exam room while observing appropriate contact time as indicated for disinfecting solutions.    CC/Reason for Visit: Annual preventive exam  HPI: Bill [name] is a 55 y.o. male who is coming in today for the above mentioned reasons. Past Medical History is significant for: Well-controlled type 2 diabetes, hypertension, gout.  He had a hip replacement on the right side earlier this year.  He has been doing quite well.  He has no acute concerns today.  He is overdue for shingles, Tdap, flu vaccinations.  He will get his second COVID booster when due.  He had a colonoscopy in 2021.  He is overdue for routine eye and dental care.  He exercises routinely.   Past Medical/Surgical History: Past Medical History:  Diagnosis Date   Arthritis    left leg   Diabetes mellitus without complication ([place])    TYPE 2    GERD (gastroesophageal reflux disease)    Gout    Hypertension     Past Surgical History:  Procedure Laterality Date   DG OPERATIVE RIGHT HIP ([place] HX)     TOTAL HIP ARTHROPLASTY Right 10/16/2020   Procedure: RIGHT POSTERIOR TOTAL HIP ARTHROPLASTY AND REMOVAL OF HARDWARE;  Surgeon: Frederik Pear, MD;  Location: [facility];  Service: Orthopedics;  Laterality: Right;    Social History:  reports that he has never smoked. He has never used smokeless tobacco. He reports that he does not currently use alcohol. He reports that he does not use drugs.  Allergies: Allergies  Allergen Reactions   Dog Epithelium     Dog hair and dander    Family History:  Family History  Problem Relation Age of Onset   Diabetes Father    Colon cancer Neg Hx    Esophageal cancer Neg Hx    Rectal cancer Neg Hx    Stomach cancer Neg Hx      Current  Outpatient Medications:    acetaminophen (TYLENOL) 500 MG tablet, Take 500 mg by mouth every 6 (six) hours as needed for moderate pain or headache., Disp: , Rfl:    allopurinol (ZYLOPRIM) 100 MG tablet, Take 1 tablet (100 mg total) by mouth daily., Disp: 90 tablet, Rfl: 1   amLODipine (NORVASC) 5 MG tablet, Take 1 tablet (5 mg total) by mouth daily., Disp: 90 tablet, Rfl: 1   aspirin EC 81 MG tablet, Take 1 tablet (81 mg total) by mouth daily., Disp: 30 tablet, Rfl: 2   colchicine 0.6 MG tablet, TAKE 1 TABLET BY MOUTH TWICE A DAY, Disp: 180 tablet, Rfl: 1   Continuous Blood Gluc Receiver (FREESTYLE LIBRE 2 READER) DEVI, 1 each by Does not apply route daily., Disp: 1 each, Rfl: 2   Continuous Blood Gluc Sensor (FREESTYLE LIBRE 2 SENSOR) MISC, 1 each by Does not apply route daily., Disp: 1 each, Rfl: 12   lisinopril (ZESTRIL) 40 MG tablet, Take 1 tablet (40 mg total) by mouth daily., Disp: 90 tablet, Rfl: 1   pantoprazole (PROTONIX) 40 MG tablet, TAKE 1 TABLET BY MOUTH EVERY DAY (Patient taking differently: Take 40 mg by mouth daily.), Disp: 90 tablet, Rfl: 1   tizanidine (ZANAFLEX) 2 MG capsule, Take 2 capsules (4 mg total) by mouth 3 (three) times daily as needed for  muscle spasms., Disp: 60 capsule, Rfl: 0  Review of Systems:  Constitutional: Denies fever, chills, diaphoresis, appetite change and fatigue.  HEENT: Denies photophobia, eye pain, redness, hearing loss, ear pain, congestion, sore throat, rhinorrhea, sneezing, mouth sores, trouble swallowing, neck pain, neck stiffness and tinnitus.   Respiratory: Denies SOB, DOE, cough, chest tightness,  and wheezing.   Cardiovascular: Denies chest pain, palpitations and leg swelling.  Gastrointestinal: Denies nausea, vomiting, abdominal pain, diarrhea, constipation, blood in stool and abdominal distention.  Genitourinary: Denies dysuria, urgency, frequency, hematuria, flank pain and difficulty urinating.  Endocrine: Denies: hot or cold intolerance,  sweats, changes in hair or nails, polyuria, polydipsia. Musculoskeletal: Denies myalgias, back pain, joint swelling, arthralgias and gait problem.  Skin: Denies pallor, rash and wound.  Neurological: Denies dizziness, seizures, syncope, weakness, light-headedness, numbness and headaches.  Hematological: Denies adenopathy. Easy bruising, personal or family bleeding history  Psychiatric/Behavioral: Denies suicidal ideation, mood changes, confusion, nervousness, sleep disturbance and agitation    Physical Exam: Vitals:   07/10/21 0857  BP: 130/84  Pulse: 82  Temp: 98.5 F (36.9 C)  TempSrc: Oral  SpO2: 94%  Weight: 235 lb 9.6 oz (106.9 kg)  Height: 5' 10.5" (1.791 m)    Body mass index is 33.33 kg/m.   Constitutional: NAD, calm, comfortable Eyes: PERRL, lids and conjunctivae normal ENMT: Mucous membranes are moist. Posterior pharynx clear of any exudate or lesions. Normal dentition. Tympanic membrane is pearly white, no erythema or bulging. Neck: normal, supple, no masses, no thyromegaly Respiratory: clear to auscultation bilaterally, no wheezing, no crackles. Normal respiratory effort. No accessory muscle use.  Cardiovascular: Regular rate and rhythm, no murmurs / rubs / gallops. No extremity edema. 2+ pedal pulses. No carotid bruits.  Abdomen: no tenderness, no masses palpated. No hepatosplenomegaly. Bowel sounds positive.  Musculoskeletal: no clubbing / cyanosis. No joint deformity upper and lower extremities. Good ROM, no contractures. Normal muscle tone.  Skin: no rashes, lesions, ulcers. No induration Neurologic: CN 2-12 grossly intact. Sensation intact, DTR normal. Strength 5/5 in all 4.  Psychiatric: Normal judgment and insight. Alert and oriented x 3. Normal mood.    Impression and Plan:  Encounter for preventive health examination -Advised routine eye and dental care. -He will obtain flu, shingles, Tdap vaccines today. -Screening labs today. -Healthy lifestyle  discussed in detail. -He had a colonoscopy in 2021 and is a 10-year callback.  Primary hypertension  -Fair control. -Continue amlodipine 5 mg daily and lisinopril 40 mg daily.  Uncontrolled type 2 diabetes mellitus with hyperglycemia ([place])  - Plan: Hemoglobin A1c -Previous A1c was well controlled at 5.7.  Hyperlipidemia, unspecified hyperlipidemia type  - Plan: Lipid panel -Would probably benefit from being on a statin, await results.  Idiopathic chronic gout of left ankle without tophus -On daily colchicine, no recent flareups.  Need for influenza vaccination -Flu vaccine administered today.  Need for Tdap vaccination -Tdap administered today.  Need for shingles vaccine -First shingles vaccine administered today.    Patient Instructions  -Nice seeing you today!!  -Lab work today; will notify you once results are available.  -flu, tetanus and shingles vaccines today.  -Schedule follow up in 6 months.    [name], MD [facility] at [facility]

## 2021-07-10 NOTE — Addendum Note (Signed)
Addended by: Westley Hummer B on: 07/10/2021 04:31 PM   Modules accepted: Orders

## 2021-07-10 NOTE — Addendum Note (Signed)
Addended by: Amanda Cockayne on: 07/10/2021 09:34 AM   Modules accepted: Orders

## 2021-07-12 ENCOUNTER — Other Ambulatory Visit: Payer: Self-pay | Admitting: Internal Medicine

## 2021-07-12 DIAGNOSIS — E559 Vitamin D deficiency, unspecified: Secondary | ICD-10-CM

## 2021-07-12 DIAGNOSIS — E782 Mixed hyperlipidemia: Secondary | ICD-10-CM

## 2021-09-26 ENCOUNTER — Other Ambulatory Visit: Payer: Self-pay | Admitting: Internal Medicine

## 2021-09-26 DIAGNOSIS — E559 Vitamin D deficiency, unspecified: Secondary | ICD-10-CM

## 2021-10-06 ENCOUNTER — Other Ambulatory Visit: Payer: Self-pay | Admitting: Internal Medicine

## 2021-10-06 DIAGNOSIS — I10 Essential (primary) hypertension: Secondary | ICD-10-CM

## 2021-11-19 ENCOUNTER — Encounter: Payer: Self-pay | Admitting: Internal Medicine

## 2021-11-19 DIAGNOSIS — I10 Essential (primary) hypertension: Secondary | ICD-10-CM

## 2021-11-20 MED ORDER — AMLODIPINE BESYLATE 5 MG PO TABS: 5.0000 mg | ORAL_TABLET | Freq: Every day | ORAL | 1 refills | Status: DC

## 2021-12-11 ENCOUNTER — Other Ambulatory Visit: Payer: Self-pay | Admitting: Internal Medicine

## 2021-12-11 DIAGNOSIS — E559 Vitamin D deficiency, unspecified: Secondary | ICD-10-CM

## 2022-01-07 ENCOUNTER — Ambulatory Visit: Payer: BC Managed Care – PPO | Admitting: Internal Medicine

## 2022-01-07 VITALS — BP 150/100 | HR 70 | Temp 98.3°F | Wt 243.9 lb

## 2022-01-07 DIAGNOSIS — I10 Essential (primary) hypertension: Secondary | ICD-10-CM | POA: Diagnosis not present

## 2022-01-07 DIAGNOSIS — Z23 Encounter for immunization: Secondary | ICD-10-CM

## 2022-01-07 DIAGNOSIS — E785 Hyperlipidemia, unspecified: Secondary | ICD-10-CM | POA: Diagnosis not present

## 2022-01-07 DIAGNOSIS — E1169 Type 2 diabetes mellitus with other specified complication: Secondary | ICD-10-CM | POA: Diagnosis not present

## 2022-01-07 LAB — POCT GLYCOSYLATED HEMOGLOBIN (HGB A1C): Hemoglobin A1C: 5.9 % — AB (ref 4.0–5.6)

## 2022-01-07 MED ORDER — VALSARTAN-HYDROCHLOROTHIAZIDE 320-25 MG PO TABS: 1.0000 | ORAL_TABLET | Freq: Every day | ORAL | 3 refills | Status: DC

## 2022-01-07 NOTE — Patient Instructions (Signed)
-  Nice seeing you today!! ? ?-Lab work today; will notify you once results are available. ? ?-Stop lisinopril. ? ?-Start diovan HCT 320/25 mg; continue amlodipine 5 mg daily. ? ?-Schedule follow up in 6 weeks with home BP measurements. ?

## 2022-01-07 NOTE — Progress Notes (Signed)
? ? ? ?Established Patient Office Visit ? ? ? ? ?This visit occurred during the SARS-CoV-2 public health emergency.  Safety protocols were in place, including screening questions prior to the visit, additional usage of staff PPE, and extensive cleaning of exam room while observing appropriate contact time as indicated for disinfecting solutions.  ? ? ?CC/Reason for Visit: Follow-up chronic medical conditions ? ?HPI: Bill [name] is a 56 y.o. male who is coming in today for the above mentioned reasons. Past Medical History is significant for: Well-controlled type 2 diabetes, hypertension, gout.  He had a hip replacement on the right side in 2022.  He is still concerned about his elevated blood pressure.  At home his measurements are more in the 140s to upper 80 range but still elevated.  He is requesting a second shingles vaccine today. ? ? ?Past Medical/Surgical History: ?Past Medical History:  ?Diagnosis Date  ? Arthritis   ? left leg  ? Diabetes mellitus without complication ([place])   ? TYPE 2   ? GERD (gastroesophageal reflux disease)   ? Gout   ? Hypertension   ? ? ?Past Surgical History:  ?Procedure Laterality Date  ? DG OPERATIVE RIGHT HIP ([place] HX)    ? TOTAL HIP ARTHROPLASTY Right 10/16/2020  ? Procedure: RIGHT POSTERIOR TOTAL HIP ARTHROPLASTY AND REMOVAL OF HARDWARE;  Surgeon: Frederik Pear, MD;  Location: [facility];  Service: Orthopedics;  Laterality: Right;  ? ? ?Social History: ? reports that he has never smoked. He has never used smokeless tobacco. He reports that he does not currently use alcohol. He reports that he does not use drugs. ? ?Allergies: ?Allergies  ?Allergen Reactions  ? Dog Epithelium   ?  Dog hair and dander  ? ? ?Family History:  ?Family History  ?Problem Relation Age of Onset  ? Diabetes Father   ? Colon cancer Neg Hx   ? Esophageal cancer Neg Hx   ? Rectal cancer Neg Hx   ? Stomach cancer Neg Hx   ? ? ? ?Current Outpatient Medications:  ?  allopurinol (ZYLOPRIM) 100 MG tablet, Take 1  tablet (100 mg total) by mouth daily., Disp: 90 tablet, Rfl: 1 ?  amLODipine (NORVASC) 5 MG tablet, Take 1 tablet (5 mg total) by mouth daily., Disp: 90 tablet, Rfl: 1 ?  atorvastatin (LIPITOR) 40 MG tablet, Take 1 tablet (40 mg total) by mouth daily., Disp: 90 tablet, Rfl: 1 ?  colchicine 0.6 MG tablet, TAKE 1 TABLET BY MOUTH TWICE A DAY, Disp: 180 tablet, Rfl: 1 ?  Continuous Blood Gluc Receiver (FREESTYLE LIBRE 2 READER) DEVI, 1 each by Does not apply route daily., Disp: 1 each, Rfl: 2 ?  Continuous Blood Gluc Sensor (FREESTYLE LIBRE 2 SENSOR) MISC, 1 each by Does not apply route daily., Disp: 1 each, Rfl: 12 ?  pantoprazole (PROTONIX) 40 MG tablet, TAKE 1 TABLET BY MOUTH EVERY DAY (Patient taking differently: Take 40 mg by mouth daily.), Disp: 90 tablet, Rfl: 1 ?  valsartan-hydrochlorothiazide (DIOVAN-HCT) 320-25 MG tablet, Take 1 tablet by mouth daily., Disp: 90 tablet, Rfl: 3 ? ?Review of Systems:  ?Constitutional: Denies fever, chills, diaphoresis, appetite change and fatigue.  ?HEENT: Denies photophobia, eye pain, redness, hearing loss, ear pain, congestion, sore throat, rhinorrhea, sneezing, mouth sores, trouble swallowing, neck pain, neck stiffness and tinnitus.   ?Respiratory: Denies SOB, DOE, cough, chest tightness,  and wheezing.   ?Cardiovascular: Denies chest pain, palpitations and leg swelling.  ?Gastrointestinal: Denies nausea, vomiting, abdominal pain, diarrhea,  constipation, blood in stool and abdominal distention.  ?Genitourinary: Denies dysuria, urgency, frequency, hematuria, flank pain and difficulty urinating.  ?Endocrine: Denies: hot or cold intolerance, sweats, changes in hair or nails, polyuria, polydipsia. ?Musculoskeletal: Denies myalgias, back pain, joint swelling, arthralgias and gait problem.  ?Skin: Denies pallor, rash and wound.  ?Neurological: Denies dizziness, seizures, syncope, weakness, light-headedness, numbness and headaches.  ?Hematological: Denies adenopathy. Easy bruising,  personal or family bleeding history  ?Psychiatric/Behavioral: Denies suicidal ideation, mood changes, confusion, nervousness, sleep disturbance and agitation ? ? ? ?Physical Exam: ?Vitals:  ? 01/07/22 0920  ?BP: (!) 150/100  ?Pulse: 70  ?Temp: 98.3 ?F (36.8 ?C)  ?TempSrc: Oral  ?SpO2: 99%  ?Weight: 243 lb 14.4 oz (110.6 kg)  ? ? ?Body mass index is 34.5 kg/m?. ? ? ? ?Constitutional: NAD, calm, comfortable ?Eyes: PERRL, lids and conjunctivae normal ?ENMT: Mucous membranes are moist.  ?Respiratory: clear to auscultation bilaterally, no wheezing, no crackles. Normal respiratory effort. No accessory muscle use.  ?Cardiovascular: Regular rate and rhythm, no murmurs / rubs / gallops. No extremity edema.  ?Neurologic: Grossly intact and nonfocal ?Psychiatric: Normal judgment and insight. Alert and oriented x 3. Normal mood.  ? ? ?Impression and Plan: ? ?Type 2 diabetes mellitus with other specified complication, without long-term current use of insulin ([place]) ? - Plan: POCT glycosylated hemoglobin (Hb A1C) ?-A1c demonstrates good control at 6.3. ? ?Primary hypertension  ?- Plan: Comprehensive metabolic panel, valsartan-hydrochlorothiazide (DIOVAN-HCT) 320-25 MG tablet ?-Blood pressure remains uncontrolled, stop lisinopril, start Diovan HCT 320/25 mg, continue amlodipine 5 mg daily. ?-He will follow-up in 6 weeks with ambulatory measurements. ? ?Hyperlipidemia, unspecified hyperlipidemia type  ?- Plan: Lipid panel ?-In September his LDL was 185 with a total cholesterol of 257 and triglycerides of 132, he was started on atorvastatin 40 mg daily and is due to have lipids rechecked today. ? ?Time spent: 32 minutes reviewing chart, interviewing and examining patient and formulating plan of care. ? ? ?Patient Instructions  ?-Nice seeing you today!! ? ?-Lab work today; will notify you once results are available. ? ?-Stop lisinopril. ? ?-Start diovan HCT 320/25 mg; continue amlodipine 5 mg daily. ? ?-Schedule follow up in 6 weeks  with home BP measurements. ? ? ? ?[name], MD ?[facility] at [facility] ? ? ?

## 2022-02-02 ENCOUNTER — Other Ambulatory Visit: Payer: Self-pay | Admitting: Internal Medicine

## 2022-02-02 DIAGNOSIS — E782 Mixed hyperlipidemia: Secondary | ICD-10-CM

## 2022-02-18 ENCOUNTER — Ambulatory Visit: Payer: BC Managed Care – PPO | Admitting: Internal Medicine

## 2022-02-18 ENCOUNTER — Encounter: Payer: Self-pay | Admitting: Internal Medicine

## 2022-02-18 VITALS — BP 130/84 | HR 80 | Temp 98.5°F | Wt 244.9 lb

## 2022-02-18 DIAGNOSIS — I10 Essential (primary) hypertension: Secondary | ICD-10-CM

## 2022-02-18 DIAGNOSIS — E1169 Type 2 diabetes mellitus with other specified complication: Secondary | ICD-10-CM

## 2022-02-18 DIAGNOSIS — E785 Hyperlipidemia, unspecified: Secondary | ICD-10-CM | POA: Diagnosis not present

## 2022-02-18 LAB — COMPREHENSIVE METABOLIC PANEL
ALT: 14 U/L (ref 0–53)
AST: 22 U/L (ref 0–37)
Albumin: 4.4 g/dL (ref 3.5–5.2)
Alkaline Phosphatase: 66 U/L (ref 39–117)
BUN: 43 mg/dL — ABNORMAL HIGH (ref 6–23)
CO2: 26 mEq/L (ref 19–32)
Calcium: 9.8 mg/dL (ref 8.4–10.5)
Chloride: 102 mEq/L (ref 96–112)
Creatinine, Ser: 2.21 mg/dL — ABNORMAL HIGH (ref 0.40–1.50)
GFR: 32.71 mL/min — ABNORMAL LOW (ref >60.00)
Glucose, Bld: 146 mg/dL — ABNORMAL HIGH (ref 70–99)
Potassium: 4.5 mEq/L (ref 3.5–5.1)
Sodium: 136 mEq/L (ref 135–145)
Total Bilirubin: 1 mg/dL (ref 0.2–1.2)
Total Protein: 9 g/dL — ABNORMAL HIGH (ref 6.0–8.3)

## 2022-02-18 LAB — LIPID PANEL
Cholesterol: 269 mg/dL — ABNORMAL HIGH (ref 0–200)
HDL: 42.8 mg/dL (ref >39.00)
LDL Cholesterol: 192 mg/dL — ABNORMAL HIGH (ref 0–99)
NonHDL: 226.49
Total CHOL/HDL Ratio: 6
Triglycerides: 171 mg/dL — ABNORMAL HIGH (ref 0.0–149.0)
VLDL: 34.2 mg/dL (ref 0.0–40.0)

## 2022-02-18 NOTE — Progress Notes (Signed)
? ? ? ?Established Patient Office Visit ? ? ? ? ?This visit occurred during the SARS-CoV-2 public health emergency.  Safety protocols were in place, including screening questions prior to the visit, additional usage of staff PPE, and extensive cleaning of exam room while observing appropriate contact time as indicated for disinfecting solutions.  ? ? ?CC/Reason for Visit: Follow-up chronic medical conditions ? ?HPI: Geramy Lamorte Taite is a 56 y.o. male who is coming in today for the above mentioned reasons. Past Medical History is significant for: Hypertension, hyperlipidemia, gout, type 2 diabetes that has been well controlled.  Last visit he was seen for elevated blood pressure, he was placed on Diovan in addition to amlodipine and is here today for follow-up.  He states his home blood pressures have been in the 120/70 range.  He feels well and has no acute concerns.  He is due to have cholesterol levels rechecked. ? ? ?Past Medical/Surgical History: ?Past Medical History:  ?Diagnosis Date  ? Arthritis   ? left leg  ? Diabetes mellitus without complication ([place])   ? TYPE 2   ? GERD (gastroesophageal reflux disease)   ? Gout   ? Hypertension   ? ? ?Past Surgical History:  ?Procedure Laterality Date  ? DG OPERATIVE RIGHT HIP ([place] HX)    ? TOTAL HIP ARTHROPLASTY Right 10/16/2020  ? Procedure: RIGHT POSTERIOR TOTAL HIP ARTHROPLASTY AND REMOVAL OF HARDWARE;  Surgeon: Frederik Pear, MD;  [facility]: [facility];  Service: Orthopedics;  Laterality: Right;  ? ? ?Social History: ? reports that he has never smoked. He has never used smokeless tobacco. He reports that he does not currently use alcohol. He reports that he does not use drugs. ? ?Allergies: ?Allergies  ?Allergen Reactions  ? Dog Epithelium   ?  Dog hair and dander  ? ? ?Family History:  ?Family History  ?Problem Relation Age of Onset  ? Diabetes Father   ? Colon cancer Neg Hx   ? Esophageal cancer Neg Hx   ? Rectal cancer Neg Hx   ? Stomach cancer Neg Hx   ? ? ? ?Current  Outpatient Medications:  ?  allopurinol (ZYLOPRIM) 100 MG tablet, Take 1 tablet (100 mg total) by mouth daily., Disp: 90 tablet, Rfl: 1 ?  amLODipine (NORVASC) 5 MG tablet, Take 1 tablet (5 mg total) by mouth daily., Disp: 90 tablet, Rfl: 1 ?  atorvastatin (LIPITOR) 40 MG tablet, Take 1 tablet (40 mg total) by mouth daily., Disp: 90 tablet, Rfl: 1 ?  colchicine 0.6 MG tablet, TAKE 1 TABLET BY MOUTH TWICE A DAY, Disp: 180 tablet, Rfl: 1 ?  Continuous Blood Gluc Receiver (FREESTYLE LIBRE 2 READER) DEVI, 1 each by Does not apply route daily., Disp: 1 each, Rfl: 2 ?  Continuous Blood Gluc Sensor (FREESTYLE LIBRE 2 SENSOR) MISC, 1 each by Does not apply route daily., Disp: 1 each, Rfl: 12 ?  pantoprazole (PROTONIX) 40 MG tablet, TAKE 1 TABLET BY MOUTH EVERY DAY (Patient taking differently: Take 40 mg by mouth daily.), Disp: 90 tablet, Rfl: 1 ?  valsartan-hydrochlorothiazide (DIOVAN-HCT) 320-25 MG tablet, Take 1 tablet by mouth daily., Disp: 90 tablet, Rfl: 3 ? ?Review of Systems:  ?Constitutional: Denies fever, chills, diaphoresis, appetite change and fatigue.  ?HEENT: Denies photophobia, eye pain, redness, hearing loss, ear pain, congestion, sore throat, rhinorrhea, sneezing, mouth sores, trouble swallowing, neck pain, neck stiffness and tinnitus.   ?Respiratory: Denies SOB, DOE, cough, chest tightness,  and wheezing.   ?Cardiovascular: Denies chest  pain, palpitations and leg swelling.  ?Gastrointestinal: Denies nausea, vomiting, abdominal pain, diarrhea, constipation, blood in stool and abdominal distention.  ?Genitourinary: Denies dysuria, urgency, frequency, hematuria, flank pain and difficulty urinating.  ?Endocrine: Denies: hot or cold intolerance, sweats, changes in hair or nails, polyuria, polydipsia. ?Musculoskeletal: Denies myalgias, back pain, joint swelling, arthralgias and gait problem.  ?Skin: Denies pallor, rash and wound.  ?Neurological: Denies dizziness, seizures, syncope, weakness, light-headedness,  numbness and headaches.  ?Hematological: Denies adenopathy. Easy bruising, personal or family bleeding history  ?Psychiatric/Behavioral: Denies suicidal ideation, mood changes, confusion, nervousness, sleep disturbance and agitation ? ? ? ?Physical Exam: ?Vitals:  ? 02/18/22 0934  ?BP: 130/84  ?Pulse: 80  ?Temp: 98.5 ?F (36.9 ?C)  ?TempSrc: Oral  ?SpO2: 99%  ?Weight: 244 lb 14.4 oz (111.1 kg)  ? ? ?Body mass index is 34.64 kg/m?. ? ? ?Constitutional: NAD, calm, comfortable ?Eyes: PERRL, lids and conjunctivae normal ?ENMT: Mucous membranes are moist.  ?Respiratory: clear to auscultation bilaterally, no wheezing, no crackles. Normal respiratory effort. No accessory muscle use.  ?Cardiovascular: Regular rate and rhythm, no murmurs / rubs / gallops. No extremity edema.  ?Psychiatric: Normal judgment and insight. Alert and oriented x 3. Normal mood.  ? ? ?Impression and Plan: ? ?Primary hypertension  ?- Plan: Comprehensive metabolic panel ?-Improved control. ?-Continue amlodipine 5 mg daily and Diovan HCT 320/25 mg daily. ? ?Hyperlipidemia, unspecified hyperlipidemia type ? - Plan: Lipid panel ?-Currently on atorvastatin 40 mg. ?-Last cholesterol panel in September 2022 with a total cholesterol of 257, triglycerides 132 and LDL 185. ? ?Type 2 diabetes mellitus with other specified complication, without long-term current use of insulin (H[facility]) ?-A1c was 5.9 at last check in March. ? ? ? ?Time spent:31 minutes reviewing chart, interviewing and examining patient and formulating plan of care. ? ? ? ?[name], MD ?[facility] at [facility] ? ? ?

## 2022-02-25 ENCOUNTER — Other Ambulatory Visit: Payer: Self-pay | Admitting: Internal Medicine

## 2022-02-25 ENCOUNTER — Other Ambulatory Visit: Payer: Self-pay | Admitting: *Deleted

## 2022-02-25 DIAGNOSIS — E785 Hyperlipidemia, unspecified: Secondary | ICD-10-CM

## 2022-02-25 DIAGNOSIS — I10 Essential (primary) hypertension: Secondary | ICD-10-CM

## 2022-02-25 DIAGNOSIS — R899 Unspecified abnormal finding in specimens from other organs, systems and tissues: Secondary | ICD-10-CM

## 2022-02-25 MED ORDER — VALSARTAN 320 MG PO TABS: 320.0000 mg | ORAL_TABLET | Freq: Every day | ORAL | 1 refills | Status: DC

## 2022-02-25 NOTE — Progress Notes (Signed)
1. Cholesterol is extremely elevated, is he taking lipitor 40 mg daily? Labs would indicate he likely isn't. If he is, increase to 80 mg daily and recheck lipids in 3 months. High risk for heart disease and stroke at these levels. ? ?2. Significant worsening of his kidney function. Likely related in part to DM and in part to HCTZ portion of diovan HCT. Please DC diovan HCT. Will send for diovan alone. Needs BMP in 2 weeks and OV in 6 weeks to follow. Please also DC colchicine as it can worsen kidney function.

## 2022-03-11 ENCOUNTER — Encounter: Payer: Self-pay | Admitting: Internal Medicine

## 2022-03-11 ENCOUNTER — Other Ambulatory Visit (INDEPENDENT_AMBULATORY_CARE_PROVIDER_SITE_OTHER): Payer: BC Managed Care – PPO

## 2022-03-11 DIAGNOSIS — R899 Unspecified abnormal finding in specimens from other organs, systems and tissues: Secondary | ICD-10-CM

## 2022-03-11 LAB — BASIC METABOLIC PANEL
BUN: 22 mg/dL (ref 6–23)
CO2: 26 mEq/L (ref 19–32)
Calcium: 9.5 mg/dL (ref 8.4–10.5)
Chloride: 103 mEq/L (ref 96–112)
Creatinine, Ser: 1.5 mg/dL (ref 0.40–1.50)
GFR: 52.06 mL/min — ABNORMAL LOW (ref >60.00)
Glucose, Bld: 123 mg/dL — ABNORMAL HIGH (ref 70–99)
Potassium: 4.1 mEq/L (ref 3.5–5.1)
Sodium: 138 mEq/L (ref 135–145)

## 2022-04-06 ENCOUNTER — Other Ambulatory Visit: Payer: Self-pay | Admitting: Adult Health

## 2022-04-06 DIAGNOSIS — I10 Essential (primary) hypertension: Secondary | ICD-10-CM

## 2022-04-09 ENCOUNTER — Ambulatory Visit: Payer: BC Managed Care – PPO | Admitting: Internal Medicine

## 2022-04-09 ENCOUNTER — Encounter: Payer: Self-pay | Admitting: Internal Medicine

## 2022-04-09 VITALS — BP 132/90 | HR 64 | Temp 97.8°F | Wt 245.7 lb

## 2022-04-09 DIAGNOSIS — E785 Hyperlipidemia, unspecified: Secondary | ICD-10-CM

## 2022-04-09 DIAGNOSIS — I10 Essential (primary) hypertension: Secondary | ICD-10-CM | POA: Diagnosis not present

## 2022-04-09 DIAGNOSIS — E1169 Type 2 diabetes mellitus with other specified complication: Secondary | ICD-10-CM | POA: Diagnosis not present

## 2022-04-09 DIAGNOSIS — M1A0720 Idiopathic chronic gout, left ankle and foot, without tophus (tophi): Secondary | ICD-10-CM | POA: Diagnosis not present

## 2022-04-09 LAB — POCT GLYCOSYLATED HEMOGLOBIN (HGB A1C): Hemoglobin A1C: 6.5 % — AB (ref 4.0–5.6)

## 2022-04-09 LAB — MICROALBUMIN / CREATININE URINE RATIO
Creatinine,U: 106.9 mg/dL
Microalb Creat Ratio: 0.7 mg/g (ref 0.0–30.0)
Microalb, Ur: <0.7 mg/dL (ref 0.0–1.9)

## 2022-04-09 MED ORDER — CARVEDILOL 3.125 MG PO TABS: 3.1250 mg | ORAL_TABLET | Freq: Two times a day (BID) | ORAL | 1 refills | Status: DC

## 2022-04-09 MED ORDER — ALLOPURINOL 100 MG PO TABS: 100.0000 mg | ORAL_TABLET | Freq: Every day | ORAL | 1 refills | Status: DC

## 2022-04-09 NOTE — Progress Notes (Signed)
Established Patient Office Visit     CC/Reason for Visit: Follow-up chronic medical conditions  HPI: Bill [name] is a 56 y.o. male who is coming in today for the above mentioned reasons. Past Medical History is significant for: Hypertension, hyperlipidemia, type 2 diabetes and gout.  At last visit he had acute on chronic kidney disease and so the hydrochlorothiazide part of his Diovan HCT was discontinued.  On repeat labs his renal function had returned to baseline.  He is here today for blood pressure follow-up.  Unsurprisingly it is elevated on 2 separate checks to around 130/90.  He had not been taking his atorvastatin and his LDL was elevated to 192.  He resumed his atorvastatin 40 mg in April.  He had a gout flareup 3 weeks ago.  He has not been taking his maintenance allopurinol.   Past Medical/Surgical History: Past Medical History:  Diagnosis Date   Arthritis    left leg   Diabetes mellitus without complication ([place])    TYPE 2    GERD (gastroesophageal reflux disease)    Gout    Hypertension     Past Surgical History:  Procedure Laterality Date   DG OPERATIVE RIGHT HIP ([name])     TOTAL HIP ARTHROPLASTY Right 10/16/2020   Procedure: RIGHT POSTERIOR TOTAL HIP ARTHROPLASTY AND REMOVAL OF HARDWARE;  Surgeon: Frederik Pear, MD;  Location: [facility];  Service: Orthopedics;  Laterality: Right;    Social History:  reports that he has never smoked. He has never used smokeless tobacco. He reports that he does not currently use alcohol. He reports that he does not use drugs.  Allergies: Allergies  Allergen Reactions   Dog Epithelium     Dog hair and dander    Family History:  Family History  Problem Relation Age of Onset   Diabetes Father    Colon cancer Neg Hx    Esophageal cancer Neg Hx    Rectal cancer Neg Hx    Stomach cancer Neg Hx      Current Outpatient Medications:    amLODipine (NORVASC) 5 MG tablet, Take 1 tablet (5 mg total) by mouth daily., Disp:  90 tablet, Rfl: 1   atorvastatin (LIPITOR) 40 MG tablet, Take 1 tablet (40 mg total) by mouth daily., Disp: 90 tablet, Rfl: 1   carvedilol (COREG) 3.125 MG tablet, Take 1 tablet (3.125 mg total) by mouth 2 (two) times daily with a meal., Disp: 180 tablet, Rfl: 1   Continuous Blood Gluc Receiver (FREESTYLE LIBRE 2 READER) DEVI, 1 each by Does not apply route daily., Disp: 1 each, Rfl: 2   Continuous Blood Gluc Sensor (FREESTYLE LIBRE 2 SENSOR) MISC, 1 each by Does not apply route daily., Disp: 1 each, Rfl: 12   pantoprazole (PROTONIX) 40 MG tablet, TAKE 1 TABLET BY MOUTH EVERY DAY (Patient taking differently: Take 40 mg by mouth daily.), Disp: 90 tablet, Rfl: 1   valsartan (DIOVAN) 320 MG tablet, Take 1 tablet (320 mg total) by mouth daily., Disp: 90 tablet, Rfl: 1   allopurinol (ZYLOPRIM) 100 MG tablet, Take 1 tablet (100 mg total) by mouth daily., Disp: 90 tablet, Rfl: 1  Review of Systems:  Constitutional: Denies fever, chills, diaphoresis, appetite change and fatigue.  HEENT: Denies photophobia, eye pain, redness, hearing loss, ear pain, congestion, sore throat, rhinorrhea, sneezing, mouth sores, trouble swallowing, neck pain, neck stiffness and tinnitus.   Respiratory: Denies SOB, DOE, cough, chest tightness,  and wheezing.   Cardiovascular: Denies  chest pain, palpitations and leg swelling.  Gastrointestinal: Denies nausea, vomiting, abdominal pain, diarrhea, constipation, blood in stool and abdominal distention.  Genitourinary: Denies dysuria, urgency, frequency, hematuria, flank pain and difficulty urinating.  Endocrine: Denies: hot or cold intolerance, sweats, changes in hair or nails, polyuria, polydipsia. Musculoskeletal: Denies myalgias, back pain, joint swelling, arthralgias and gait problem.  Skin: Denies pallor, rash and wound.  Neurological: Denies dizziness, seizures, syncope, weakness, light-headedness, numbness and headaches.  Hematological: Denies adenopathy. Easy bruising,  personal or family bleeding history  Psychiatric/Behavioral: Denies suicidal ideation, mood changes, confusion, nervousness, sleep disturbance and agitation    Physical Exam: Vitals:   04/09/22 0910 04/09/22 0912  BP: 130/90 132/90  Pulse: 64   Temp: 97.8 F (36.6 C)   TempSrc: Oral   SpO2: 99%   Weight: 245 lb 11.2 oz (111.4 kg)     Body mass index is 34.76 kg/m.   Constitutional: NAD, calm, comfortable Eyes: PERRL, lids and conjunctivae normal ENMT: Mucous membranes are moist.  Respiratory: clear to auscultation bilaterally, no wheezing, no crackles. Normal respiratory effort. No accessory muscle use.  Cardiovascular: Regular rate and rhythm, no murmurs / rubs / gallops. No extremity edema.  Neurologic: Grossly intact and nonfocal Psychiatric: Normal judgment and insight. Alert and oriented x 3. Normal mood.    Diabetic Foot Exam - Simple   Simple Foot Form Diabetic Foot exam was performed with the following findings: Yes 04/09/2022  9:31 AM  Visual Inspection No deformities, no ulcerations, no other skin breakdown bilaterally: Yes Sensation Testing Intact to touch and monofilament testing bilaterally: Yes Pulse Check Posterior Tibialis and Dorsalis pulse intact bilaterally: Yes Comments      Impression and Plan:  Type 2 diabetes mellitus with other specified complication, without long-term current use of insulin ([place])  - Plan: POCT glycosylated hemoglobin (Hb A1C), Microalbumin/Creatinine Ratio, Urine -A1c demonstrates excellent control at 6.5. -Check microalbumin, foot exam performed today.  Idiopathic chronic gout of left ankle without tophus  - Plan: allopurinol (ZYLOPRIM) 100 MG tablet  Primary hypertension  - Plan: carvedilol (COREG) 3.125 MG tablet -Not at goal despite amlodipine 5 mg and valsartan 320 mg daily. -Will add carvedilol 3.125 mg twice daily and he will return in 8 weeks for follow-up.  Hyperlipidemia, unspecified hyperlipidemia type -He  is back on atorvastatin 40 mg, tolerating well, recheck lipids next visit.    Time spent:36 minutes reviewing chart, interviewing and examining patient and formulating plan of care.   Patient Instructions  -Nice seeing you today!!  -Start carvedilol 3.125 mg twice daily.  -Start allopurinol 100 mg daily.  -Schedule follow up in 8 weeks. We will do labs then.    [name], MD [facility] at [facility]

## 2022-04-09 NOTE — Patient Instructions (Signed)
-  Nice seeing you today!!  -Start carvedilol 3.125 mg twice daily.  -Start allopurinol 100 mg daily.  -Schedule follow up in 8 weeks. We will do labs then.

## 2022-04-25 ENCOUNTER — Other Ambulatory Visit: Payer: Self-pay | Admitting: Internal Medicine

## 2022-04-25 DIAGNOSIS — E782 Mixed hyperlipidemia: Secondary | ICD-10-CM

## 2022-05-23 ENCOUNTER — Ambulatory Visit: Payer: BC Managed Care – PPO | Admitting: Internal Medicine

## 2022-06-03 ENCOUNTER — Other Ambulatory Visit: Payer: Self-pay | Admitting: Internal Medicine

## 2022-06-03 ENCOUNTER — Ambulatory Visit: Payer: BC Managed Care – PPO | Admitting: Internal Medicine

## 2022-06-03 VITALS — BP 120/80 | HR 64 | Temp 98.0°F | Wt 251.0 lb

## 2022-06-03 DIAGNOSIS — I10 Essential (primary) hypertension: Secondary | ICD-10-CM

## 2022-06-03 DIAGNOSIS — E559 Vitamin D deficiency, unspecified: Secondary | ICD-10-CM | POA: Diagnosis not present

## 2022-06-03 DIAGNOSIS — E1169 Type 2 diabetes mellitus with other specified complication: Secondary | ICD-10-CM

## 2022-06-03 DIAGNOSIS — E785 Hyperlipidemia, unspecified: Secondary | ICD-10-CM

## 2022-06-03 DIAGNOSIS — M1A0720 Idiopathic chronic gout, left ankle and foot, without tophus (tophi): Secondary | ICD-10-CM

## 2022-06-03 DIAGNOSIS — E782 Mixed hyperlipidemia: Secondary | ICD-10-CM

## 2022-06-03 LAB — LIPID PANEL
Cholesterol: 198 mg/dL (ref 0–200)
HDL: 42.9 mg/dL (ref >39.00)
LDL Cholesterol: 137 mg/dL — ABNORMAL HIGH (ref 0–99)
NonHDL: 155.25
Total CHOL/HDL Ratio: 5
Triglycerides: 93 mg/dL (ref 0.0–149.0)
VLDL: 18.6 mg/dL (ref 0.0–40.0)

## 2022-06-03 LAB — COMPREHENSIVE METABOLIC PANEL
ALT: 12 U/L (ref 0–53)
AST: 26 U/L (ref 0–37)
Albumin: 4.1 g/dL (ref 3.5–5.2)
Alkaline Phosphatase: 61 U/L (ref 39–117)
BUN: 15 mg/dL (ref 6–23)
CO2: 28 mEq/L (ref 19–32)
Calcium: 9 mg/dL (ref 8.4–10.5)
Chloride: 104 mEq/L (ref 96–112)
Creatinine, Ser: 1.33 mg/dL (ref 0.40–1.50)
GFR: 60.05 mL/min (ref >60.00)
Glucose, Bld: 133 mg/dL — ABNORMAL HIGH (ref 70–99)
Potassium: 4.4 mEq/L (ref 3.5–5.1)
Sodium: 138 mEq/L (ref 135–145)
Total Bilirubin: 0.9 mg/dL (ref 0.2–1.2)
Total Protein: 8.2 g/dL (ref 6.0–8.3)

## 2022-06-03 LAB — POCT GLYCOSYLATED HEMOGLOBIN (HGB A1C): Hemoglobin A1C: 6.2 % — AB (ref 4.0–5.6)

## 2022-06-03 MED ORDER — ATORVASTATIN CALCIUM 80 MG PO TABS: 80.0000 mg | ORAL_TABLET | Freq: Every day | ORAL | 1 refills | Status: DC

## 2022-06-03 NOTE — Patient Instructions (Signed)
-  Nice seeing you today!!  -Lab work today; will notify you once results are available.  -Schedule follow up in 3-4 months. 

## 2022-06-03 NOTE — Progress Notes (Signed)
Established Patient Office Visit     CC/Reason for Visit: 30-monthfollow-up chronic medical conditions  HPI: Bill GlaspyBirth is a 56y.o. male who is coming in today for the above mentioned reasons. Past Medical History is significant for: Hypertension, hyperlipidemia, type 2 diabetes and gout.  He has been doing well.  He is due to have his cholesterol rechecked.   Past Medical/Surgical History: Past Medical History:  Diagnosis Date   Arthritis    left leg   Diabetes mellitus without complication ([place]    TYPE 2    GERD (gastroesophageal reflux disease)    Gout    Hypertension     Past Surgical History:  Procedure Laterality Date   DG OPERATIVE RIGHT HIP ([place])     TOTAL HIP ARTHROPLASTY Right 10/16/2020   Procedure: RIGHT POSTERIOR TOTAL HIP ARTHROPLASTY AND REMOVAL OF HARDWARE;  Surgeon: RFrederik Pear MD;  Location: [facility];  Service: Orthopedics;  Laterality: Right;    Social History:  reports that he has never smoked. He has never used smokeless tobacco. He reports that he does not currently use alcohol. He reports that he does not use drugs.  Allergies: Allergies  Allergen Reactions   Dog Epithelium     Dog hair and dander    Family History:  Family History  Problem Relation Age of Onset   Diabetes Father    Colon cancer Neg Hx    Esophageal cancer Neg Hx    Rectal cancer Neg Hx    Stomach cancer Neg Hx      Current Outpatient Medications:    allopurinol (ZYLOPRIM) 100 MG tablet, Take 1 tablet (100 mg total) by mouth daily., Disp: 90 tablet, Rfl: 1   amLODipine (NORVASC) 5 MG tablet, Take 1 tablet (5 mg total) by mouth daily., Disp: 90 tablet, Rfl: 1   atorvastatin (LIPITOR) 40 MG tablet, TAKE 1 TABLET BY MOUTH EVERY DAY, Disp: 90 tablet, Rfl: 1   carvedilol (COREG) 3.125 MG tablet, Take 1 tablet (3.125 mg total) by mouth 2 (two) times daily with a meal., Disp: 180 tablet, Rfl: 1   Continuous Blood Gluc Receiver (FREESTYLE LIBRE 2 READER) DEVI, 1  each by Does not apply route daily., Disp: 1 each, Rfl: 2   Continuous Blood Gluc Sensor (FREESTYLE LIBRE 2 SENSOR) MISC, 1 each by Does not apply route daily., Disp: 1 each, Rfl: 12   pantoprazole (PROTONIX) 40 MG tablet, TAKE 1 TABLET BY MOUTH EVERY DAY (Patient taking differently: Take 40 mg by mouth daily.), Disp: 90 tablet, Rfl: 1   valsartan (DIOVAN) 320 MG tablet, Take 1 tablet (320 mg total) by mouth daily., Disp: 90 tablet, Rfl: 1  Review of Systems:  Constitutional: Denies fever, chills, diaphoresis, appetite change and fatigue.  HEENT: Denies photophobia, eye pain, redness, hearing loss, ear pain, congestion, sore throat, rhinorrhea, sneezing, mouth sores, trouble swallowing, neck pain, neck stiffness and tinnitus.   Respiratory: Denies SOB, DOE, cough, chest tightness,  and wheezing.   Cardiovascular: Denies chest pain, palpitations and leg swelling.  Gastrointestinal: Denies nausea, vomiting, abdominal pain, diarrhea, constipation, blood in stool and abdominal distention.  Genitourinary: Denies dysuria, urgency, frequency, hematuria, flank pain and difficulty urinating.  Endocrine: Denies: hot or cold intolerance, sweats, changes in hair or nails, polyuria, polydipsia. Musculoskeletal: Denies myalgias, back pain, joint swelling, arthralgias and gait problem.  Skin: Denies pallor, rash and wound.  Neurological: Denies dizziness, seizures, syncope, weakness, light-headedness, numbness and headaches.  Hematological: Denies adenopathy. Easy  bruising, personal or family bleeding history  Psychiatric/Behavioral: Denies suicidal ideation, mood changes, confusion, nervousness, sleep disturbance and agitation    Physical Exam: Vitals:   06/03/22 1048  BP: 120/80  Pulse: 64  Temp: 98 F (36.7 C)  TempSrc: Oral  SpO2: 96%  Weight: 251 lb (113.9 kg)    Body mass index is 35.51 kg/m.   Constitutional: NAD, calm, comfortable Eyes: PERRL, lids and conjunctivae normal ENMT: Mucous  membranes are moist.  Respiratory: clear to auscultation bilaterally, no wheezing, no crackles. Normal respiratory effort. No accessory muscle use.  Cardiovascular: Regular rate and rhythm, no murmurs / rubs / gallops. No extremity edema.  Psychiatric: Normal judgment and insight. Alert and oriented x 3. Normal mood.    Impression and Plan:  Type 2 diabetes mellitus with other specified complication, without long-term current use of insulin ([place])  - Plan: POCT HgB A1C, Comprehensive metabolic panel, Ambulatory referral to Ophthalmology -A1c demonstrates excellent control at 6.2, he is due for an updated eye exam.  Primary hypertension -Well-controlled on carvedilol, amlodipine, valsartan.  Hyperlipidemia, unspecified hyperlipidemia type  - Plan: Lipid panel -On atorvastatin 40 mg daily.  Vitamin D deficiency -Recheck levels with next CPE.  Idiopathic chronic gout of left ankle without tophus -On allopurinol, given information on low purine diet.    Time spent:32 minutes reviewing chart, interviewing and examining patient and formulating plan of care.   Patient Instructions  -Nice seeing you today!!  -Lab work today; will notify you once results are available.  -Schedule follow up in 3-4 months.      [name], MD [facility] at [facility]

## 2022-06-04 ENCOUNTER — Ambulatory Visit: Payer: BC Managed Care – PPO | Admitting: Internal Medicine

## 2022-07-29 ENCOUNTER — Ambulatory Visit (INDEPENDENT_AMBULATORY_CARE_PROVIDER_SITE_OTHER): Payer: BC Managed Care – PPO | Admitting: Ophthalmology

## 2022-07-29 ENCOUNTER — Encounter (INDEPENDENT_AMBULATORY_CARE_PROVIDER_SITE_OTHER): Payer: Self-pay | Admitting: Ophthalmology

## 2022-07-29 DIAGNOSIS — H43823 Vitreomacular adhesion, bilateral: Secondary | ICD-10-CM | POA: Diagnosis not present

## 2022-07-29 DIAGNOSIS — H2513 Age-related nuclear cataract, bilateral: Secondary | ICD-10-CM | POA: Diagnosis not present

## 2022-07-29 DIAGNOSIS — E1169 Type 2 diabetes mellitus with other specified complication: Secondary | ICD-10-CM

## 2022-07-29 LAB — HM DIABETES EYE EXAM

## 2022-07-29 NOTE — Assessment & Plan Note (Signed)

## 2022-07-29 NOTE — Progress Notes (Signed)
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 male who presents to the clinic today for:   HPI   Diabetic eye exam Pt states his vision has been stable Pt denies any new floaters or FOL  Pt states sometimes he has a hard time reading small print Last edited by Morene Rankins, CMA on 07/29/2022  9:20 AM.      Referring physician: Isaac Bliss, Rayford Halsted, MD [place],  [place] [id number]  HISTORICAL INFORMATION:   Selected notes from the [facility]    Lab Results  Component Value Date   HGBA1C 6.2 (A) 06/03/2022     CURRENT MEDICATIONS: No current outpatient medications on file. (Ophthalmic Drugs)   No current facility-administered medications for this visit. (Ophthalmic Drugs)   Current Outpatient Medications (Other)  Medication Sig   allopurinol (ZYLOPRIM) 100 MG tablet Take 1 tablet (100 mg total) by mouth daily.   amLODipine (NORVASC) 5 MG tablet Take 1 tablet (5 mg total) by mouth daily.   atorvastatin (LIPITOR) 80 MG tablet Take 1 tablet (80 mg total) by mouth daily.   carvedilol (COREG) 3.125 MG tablet Take 1 tablet (3.125 mg total) by mouth 2 (two) times daily with a meal.   Continuous Blood Gluc Receiver (FREESTYLE LIBRE 2 READER) DEVI 1 each by Does not apply route daily.   Continuous Blood Gluc Sensor (FREESTYLE LIBRE 2 SENSOR) MISC 1 each by Does not apply route daily.   pantoprazole (PROTONIX) 40 MG tablet TAKE 1 TABLET BY MOUTH EVERY DAY (Patient taking differently: Take 40 mg by mouth daily.)   valsartan (DIOVAN) 320 MG tablet Take 1 tablet (320 mg total) by mouth daily.   No current facility-administered medications for this visit. (Other)      REVIEW OF SYSTEMS: ROS   Negative for: Constitutional, Gastrointestinal, Neurological, Skin, Genitourinary,  Musculoskeletal, HENT, Endocrine, Cardiovascular, Eyes, Respiratory, Psychiatric, Allergic/Imm, Heme/Lymph Last edited by Morene Rankins, CMA on 07/29/2022  9:20 AM.       ALLERGIES Allergies  Allergen Reactions   Dog Epithelium     Dog hair and dander    PAST MEDICAL HISTORY Past Medical History:  Diagnosis Date   Arthritis    left leg   Diabetes mellitus without complication ([place])    TYPE 2    GERD (gastroesophageal reflux disease)    Gout    Hypertension    Past Surgical History:  Procedure Laterality Date   DG OPERATIVE RIGHT HIP ([place] HX)     TOTAL HIP ARTHROPLASTY Right 10/16/2020   Procedure: RIGHT POSTERIOR TOTAL HIP ARTHROPLASTY AND REMOVAL OF HARDWARE;  Surgeon: Frederik Pear, MD;  Location: [facility];  Service: Orthopedics;  Laterality: Right;    FAMILY HISTORY Family History  Problem Relation Age of Onset   Diabetes Father    Colon cancer Neg Hx    Esophageal cancer Neg Hx    Rectal cancer Neg Hx    Stomach cancer Neg Hx     SOCIAL HISTORY Social History   Tobacco Use   Smoking status: Never   Smokeless tobacco: Never  Vaping Use   Vaping Use: Never used  Substance Use Topics   Alcohol use: Not Currently   Drug use: No         OPHTHALMIC EXAM:  Base Eye Exam     Visual Acuity (ETDRS)       Right Left   Dist [place] 20/20 20/20         Tonometry (Tonopen, 9:24 AM)       Right Left   Pressure 15 14         Pupils       Pupils   Right PERRL   Left PERRL         Visual Fields       Left Right    Full Full         Extraocular Movement       Right Left    Ortho Ortho    -- -- --  --  --  -- -- --   -- -- --  --  --  -- -- --           Dilation     Both eyes: 1.0% Mydriacyl, 2.5% Phenylephrine @ 9:21 AM           Slit Lamp and Fundus Exam     External Exam       Right Left   External Normal Normal         Slit Lamp Exam       Right Left   Lids/Lashes Normal Normal   Conjunctiva/Sclera  White and quiet White and quiet   Cornea Clear Clear   Anterior Chamber Deep and quiet Deep and quiet   Iris Round and reactive Round and reactive   Lens Trace Nuclear sclerosis Trace Nuclear sclerosis   Anterior Vitreous Normal Normal         Fundus Exam       Right Left   Posterior Vitreous Normal Normal   Disc Normal Normal   C/D Ratio 0.2 0.2   Macula Normal Normal   Vessels no DR no DR   Periphery Normal Normal            IMAGING AND PROCEDURES  Imaging and Procedures for 07/29/22  OCT, Retina - OU - Both Eyes       Right Eye Quality was good. Scan locations included subfoveal. Central Foveal Thickness: 263. Progression has no prior data. Findings include normal foveal contour.   Left Eye Quality was good. Scan locations included subfoveal. Central Foveal Thickness: 267. Progression has no prior data. Findings include normal foveal contour.      Color Fundus Photography Optos - OU - Both Eyes       Right Eye Progression has no prior data. Disc findings include normal observations. Macula : normal observations. Vessels : normal observations. Periphery : normal observations.   Left Eye Progression has no prior data. Disc findings include normal observations. Macula : normal observations. Vessels : normal observations. Periphery : normal observations.   Notes No detectable DR             ASSESSMENT/PLAN:  Nuclear sclerotic cataract of both eyes Minor not visually significant  Vitreomacular adhesion of both eyes Physiologic, natural state no impact on acuity  DM (diabetes mellitus), type 2 ([place]) The patient has diabetes without any evidence of retinopathy. The patient advised to maintain good blood glucose control, excellent blood pressure control, and favorable levels of cholesterol, low density lipoprotein, and high density lipoproteins. Follow up in 1 year was recommended. Explained that fluctuations in visual acuity , or "out of focus", may  result from large variations of blood sugar control.     ICD-10-CM  1. Type 2 diabetes mellitus with other specified complication, without long-term current use of insulin (HCC)  E11.69     2. Vitreomacular adhesion of both eyes  H43.823 OCT, Retina - OU - Both Eyes    Color Fundus Photography Optos - OU - Both Eyes    3. Nuclear sclerotic cataract of both eyes  H25.13       1.  OU no impact on retinal vasculature at this time.  That is no detectable diabetic retinopathy  2.  Patient does not wear spectacle correction ongoing basis.  I explained to him we will probably need reading glasses or computer glasses in the coming years.  This is normal condition physiologic and obtain drugstore glasses to help  3.  Ophthalmic Meds Ordered this visit:  No orders of the defined types were placed in this encounter.      Return in about 1 year (around 07/30/2023) for DILATE OU, COLOR FP.  There are no Patient Instructions on file for this visit.   Explained the diagnoses, plan, and follow up with the patient and they expressed understanding.  Patient expressed understanding of the importance of proper follow up care.   Clent Demark Sheleen Conchas M.D. Diseases & Surgery of the Retina and Vitreous [facility] [place] 07/29/22     Abbreviations: M myopia (nearsighted); A astigmatism; H hyperopia (farsighted); P presbyopia; Mrx spectacle prescription;  CTL contact lenses; OD right eye; OS left eye; OU both eyes  XT exotropia; ET esotropia; PEK punctate epithelial keratitis; PEE punctate epithelial erosions; DES dry eye syndrome; MGD meibomian gland dysfunction; ATs artificial tears; PFAT's preservative free artificial tears; [place] nuclear sclerotic cataract; PSC posterior subcapsular cataract; ERM epi-retinal membrane; PVD posterior vitreous detachment; RD retinal detachment; DM diabetes mellitus; DR diabetic retinopathy; NPDR non-proliferative diabetic retinopathy; PDR proliferative diabetic  retinopathy; CSME clinically significant macular edema; DME diabetic macular edema; dbh dot blot hemorrhages; CWS cotton wool spot; POAG primary open angle glaucoma; C/D cup-to-disc ratio; HVF humphrey visual field; GVF goldmann visual field; OCT optical coherence tomography; IOP intraocular pressure; BRVO Branch retinal vein occlusion; CRVO central retinal vein occlusion; CRAO central retinal artery occlusion; BRAO branch retinal artery occlusion; RT retinal tear; SB scleral buckle; PPV pars plana vitrectomy; VH Vitreous hemorrhage; PRP panretinal laser photocoagulation; IVK intravitreal kenalog; VMT vitreomacular traction; MH Macular hole;  NVD neovascularization of the disc; NVE neovascularization elsewhere; AREDS age related eye disease study; ARMD age related macular degeneration; POAG primary open angle glaucoma; EBMD epithelial/anterior basement membrane dystrophy; ACIOL anterior chamber intraocular lens; IOL intraocular lens; PCIOL posterior chamber intraocular lens; Phaco/IOL phacoemulsification with intraocular lens placement; [place] photorefractive keratectomy; LASIK laser assisted in situ keratomileusis; HTN hypertension; DM diabetes mellitus; COPD chronic obstructive pulmonary disease

## 2022-07-29 NOTE — Assessment & Plan Note (Signed)
Physiologic, natural state no impact on acuity

## 2022-07-29 NOTE — Assessment & Plan Note (Signed)
Minor not visually significant

## 2022-09-04 ENCOUNTER — Encounter: Payer: Self-pay | Admitting: Internal Medicine

## 2022-09-04 ENCOUNTER — Ambulatory Visit: Payer: BC Managed Care – PPO | Admitting: Internal Medicine

## 2022-09-04 VITALS — BP 128/79 | HR 82 | Temp 98.4°F | Wt 249.0 lb

## 2022-09-04 DIAGNOSIS — E785 Hyperlipidemia, unspecified: Secondary | ICD-10-CM | POA: Diagnosis not present

## 2022-09-04 DIAGNOSIS — E1169 Type 2 diabetes mellitus with other specified complication: Secondary | ICD-10-CM

## 2022-09-04 DIAGNOSIS — M1A0720 Idiopathic chronic gout, left ankle and foot, without tophus (tophi): Secondary | ICD-10-CM | POA: Diagnosis not present

## 2022-09-04 DIAGNOSIS — I10 Essential (primary) hypertension: Secondary | ICD-10-CM | POA: Diagnosis not present

## 2022-09-04 LAB — LIPID PANEL
Cholesterol: 173 mg/dL (ref 0–200)
HDL: 43.4 mg/dL (ref >39.00)
LDL Cholesterol: 105 mg/dL — ABNORMAL HIGH (ref 0–99)
NonHDL: 129.78
Total CHOL/HDL Ratio: 4
Triglycerides: 124 mg/dL (ref 0.0–149.0)
VLDL: 24.8 mg/dL (ref 0.0–40.0)

## 2022-09-04 LAB — POCT GLYCOSYLATED HEMOGLOBIN (HGB A1C): Hemoglobin A1C: 6.7 % — AB (ref 4.0–5.6)

## 2022-09-04 NOTE — Progress Notes (Signed)
Established Patient Office Visit     CC/Reason for Visit: 65-monthfollow-up chronic medical conditions  HPI: Bill AmbrocioBirth is a 56y.o. male who is coming in today for the above mentioned reasons. Past Medical History is significant for: Hypertension, hyperlipidemia, gout, type 2 diabetes, obesity.  About a week ago he started experiencing a gout flareup of his left hand that is mainly affecting his first MCP joint.  He is otherwise feeling well.  Declines flu vaccination.   Past Medical/Surgical History: Past Medical History:  Diagnosis Date   Arthritis    left leg   Diabetes mellitus without complication ([place]    TYPE 2    GERD (gastroesophageal reflux disease)    Gout    Hypertension     Past Surgical History:  Procedure Laterality Date   [place]     TOTAL HIP ARTHROPLASTY Right 10/16/2020   Procedure: RIGHT POSTERIOR TOTAL HIP ARTHROPLASTY AND REMOVAL OF HARDWARE;  Surgeon: RFrederik Pear MD;  [facility];  Service: Orthopedics;  Laterality: Right;    Social History:  reports that he has never smoked. He has never used smokeless tobacco. He reports that he does not currently use alcohol. He reports that he does not use drugs.  Allergies: Allergies  Allergen Reactions   Dog Epithelium     Dog hair and dander    Family History:  Family History  Problem Relation Age of Onset   Diabetes Father    Colon cancer Neg Hx    Esophageal cancer Neg Hx    Rectal cancer Neg Hx    Stomach cancer Neg Hx      Current Outpatient Medications:    allopurinol (ZYLOPRIM) 100 MG tablet, Take 1 tablet (100 mg total) by mouth daily., Disp: 90 tablet, Rfl: 1   amLODipine (NORVASC) 5 MG tablet, Take 1 tablet (5 mg total) by mouth daily., Disp: 90 tablet, Rfl: 1   atorvastatin (LIPITOR) 80 MG tablet, Take 1 tablet (80 mg total) by mouth daily., Disp: 90 tablet, Rfl: 1   carvedilol (COREG) 3.125 MG tablet, Take 1 tablet (3.125 mg total) by mouth 2  (two) times daily with a meal., Disp: 180 tablet, Rfl: 1   Continuous Blood Gluc Receiver (FREESTYLE LIBRE 2 READER) DEVI, 1 each by Does not apply route daily., Disp: 1 each, Rfl: 2   Continuous Blood Gluc Sensor (FREESTYLE LIBRE 2 SENSOR) MISC, 1 each by Does not apply route daily., Disp: 1 each, Rfl: 12   pantoprazole (PROTONIX) 40 MG tablet, TAKE 1 TABLET BY MOUTH EVERY DAY (Patient taking differently: Take 40 mg by mouth daily.), Disp: 90 tablet, Rfl: 1   valsartan (DIOVAN) 320 MG tablet, Take 1 tablet (320 mg total) by mouth daily., Disp: 90 tablet, Rfl: 1  Review of Systems:  Constitutional: Denies fever, chills, diaphoresis, appetite change and fatigue.  HEENT: Denies photophobia, eye pain, redness, hearing loss, ear pain, congestion, sore throat, rhinorrhea, sneezing, mouth sores, trouble swallowing, neck pain, neck stiffness and tinnitus.   Respiratory: Denies SOB, DOE, cough, chest tightness,  and wheezing.   Cardiovascular: Denies chest pain, palpitations and leg swelling.  Gastrointestinal: Denies nausea, vomiting, abdominal pain, diarrhea, constipation, blood in stool and abdominal distention.  Genitourinary: Denies dysuria, urgency, frequency, hematuria, flank pain and difficulty urinating.  Endocrine: Denies: hot or cold intolerance, sweats, changes in hair or nails, polyuria, polydipsia. Musculoskeletal: Denies myalgias, back pain, joint swelling, arthralgias and gait problem.  Skin: Denies  pallor, rash and wound.  Neurological: Denies dizziness, seizures, syncope, weakness, light-headedness, numbness and headaches.  Hematological: Denies adenopathy. Easy bruising, personal or family bleeding history  Psychiatric/Behavioral: Denies suicidal ideation, mood changes, confusion, nervousness, sleep disturbance and agitation    Physical Exam: Vitals:   09/04/22 1058 09/04/22 1122  BP: 130/84 128/79  Pulse: 82   Temp: 98.4 F (36.9 C)   TempSrc: Oral   SpO2: 98%   Weight: 249  lb (112.9 kg)     Body mass index is 35.22 kg/m.    Constitutional: NAD, calm, comfortable Eyes: PERRL, lids and conjunctivae normal ENMT: Mucous membranes are moist.  Respiratory: clear to auscultation bilaterally, no wheezing, no crackles. Normal respiratory effort. No accessory muscle use.  Cardiovascular: Regular rate and rhythm, no murmurs / rubs / gallops. No extremity edema. Psychiatric: Normal judgment and insight. Alert and oriented x 3. Normal mood.    Impression and Plan:  Type 2 diabetes mellitus with other specified complication, without long-term current use of insulin ([place]) - Plan: POCT glycosylated hemoglobin (Hb A1C)  Idiopathic chronic gout of left ankle without tophus  Hyperlipidemia, unspecified hyperlipidemia type - Plan: Lipid panel  Primary hypertension  -A1c of 6.7 demonstrates excellent diabetic control. -Blood pressure is fairly well controlled. -At last visit his atorvastatin was increased to 80 mg in response to an LDL of 137, recheck lipids today. -Recent diabetic eye exam without signs of retinopathy. -He is having a gout flareup of his left hand.  Increase colchicine to twice daily until flare up resolved.  Time spent:31 minutes reviewing chart, interviewing and examining patient and formulating plan of care.    [name], MD [facility]

## 2022-09-12 ENCOUNTER — Other Ambulatory Visit: Payer: Self-pay | Admitting: *Deleted

## 2022-09-12 DIAGNOSIS — E785 Hyperlipidemia, unspecified: Secondary | ICD-10-CM

## 2022-09-12 MED ORDER — EZETIMIBE 10 MG PO TABS: 10.0000 mg | ORAL_TABLET | Freq: Every day | ORAL | 1 refills | Status: DC

## 2022-10-10 ENCOUNTER — Encounter: Payer: Self-pay | Admitting: Internal Medicine

## 2022-10-17 ENCOUNTER — Ambulatory Visit: Payer: BC Managed Care – PPO | Admitting: Internal Medicine

## 2022-10-17 ENCOUNTER — Encounter: Payer: Self-pay | Admitting: Internal Medicine

## 2022-10-17 VITALS — BP 124/84 | HR 70 | Temp 98.3°F | Wt 246.8 lb

## 2022-10-17 DIAGNOSIS — M109 Gout, unspecified: Secondary | ICD-10-CM

## 2022-10-17 MED ORDER — PREDNISONE 10 MG (21) PO TBPK: ORAL_TABLET | ORAL | 0 refills | Status: DC

## 2022-10-17 NOTE — Progress Notes (Signed)
Established Patient Office Visit     CC/Reason for Visit: Gout flareup  HPI: Bill [name] is a 56 y.o. male who is coming in today for the above mentioned reasons.  He was seen beginning of November for the same.  The gout is mainly affecting his left index finger MCP joint.  He has been taking colchicine ever since without much improvement.  There is visible inflammation of that joint.   Past Medical/Surgical History: Past Medical History:  Diagnosis Date   Arthritis    left leg   Diabetes mellitus without complication ([place])    TYPE 2    GERD (gastroesophageal reflux disease)    Gout    Hypertension     Past Surgical History:  Procedure Laterality Date   DG OPERATIVE RIGHT HIP ([place] HX)     TOTAL HIP ARTHROPLASTY Right 10/16/2020   Procedure: RIGHT POSTERIOR TOTAL HIP ARTHROPLASTY AND REMOVAL OF HARDWARE;  Surgeon: Frederik Pear, MD;  Location: [facility];  Service: Orthopedics;  Laterality: Right;    Social History:  reports that he has never smoked. He has never used smokeless tobacco. He reports that he does not currently use alcohol. He reports that he does not use drugs.  Allergies: Allergies  Allergen Reactions   Dog Epithelium     Dog hair and dander    Family History:  Family History  Problem Relation Age of Onset   Diabetes Father    Colon cancer Neg Hx    Esophageal cancer Neg Hx    Rectal cancer Neg Hx    Stomach cancer Neg Hx      Current Outpatient Medications:    allopurinol (ZYLOPRIM) 100 MG tablet, Take 1 tablet (100 mg total) by mouth daily., Disp: 90 tablet, Rfl: 1   amLODipine (NORVASC) 5 MG tablet, Take 1 tablet (5 mg total) by mouth daily., Disp: 90 tablet, Rfl: 1   atorvastatin (LIPITOR) 80 MG tablet, Take 1 tablet (80 mg total) by mouth daily., Disp: 90 tablet, Rfl: 1   carvedilol (COREG) 3.125 MG tablet, Take 1 tablet (3.125 mg total) by mouth 2 (two) times daily with a meal., Disp: 180 tablet, Rfl: 1   Continuous Blood Gluc Receiver  (FREESTYLE LIBRE 2 READER) DEVI, 1 each by Does not apply route daily., Disp: 1 each, Rfl: 2   Continuous Blood Gluc Sensor (FREESTYLE LIBRE 2 SENSOR) MISC, 1 each by Does not apply route daily., Disp: 1 each, Rfl: 12   ezetimibe (ZETIA) 10 MG tablet, Take 1 tablet (10 mg total) by mouth daily., Disp: 90 tablet, Rfl: 1   pantoprazole (PROTONIX) 40 MG tablet, TAKE 1 TABLET BY MOUTH EVERY DAY (Patient taking differently: Take 40 mg by mouth daily.), Disp: 90 tablet, Rfl: 1   predniSONE (STERAPRED UNI-PAK 21 TAB) 10 MG (21) TBPK tablet, Take as directed, Disp: 21 tablet, Rfl: 0   valsartan (DIOVAN) 320 MG tablet, Take 1 tablet (320 mg total) by mouth daily., Disp: 90 tablet, Rfl: 1  Review of Systems:  Constitutional: Denies fever, chills, diaphoresis, appetite change and fatigue.  HEENT: Denies photophobia, eye pain, redness, hearing loss, ear pain, congestion, sore throat, rhinorrhea, sneezing, mouth sores, trouble swallowing, neck pain, neck stiffness and tinnitus.   Respiratory: Denies SOB, DOE, cough, chest tightness,  and wheezing.   Cardiovascular: Denies chest pain, palpitations and leg swelling.  Gastrointestinal: Denies nausea, vomiting, abdominal pain, diarrhea, constipation, blood in stool and abdominal distention.  Genitourinary: Denies dysuria, urgency, frequency, hematuria, flank  pain and difficulty urinating.  Endocrine: Denies: hot or cold intolerance, sweats, changes in hair or nails, polyuria, polydipsia.  Skin: Denies pallor, rash and wound.  Neurological: Denies dizziness, seizures, syncope, weakness, light-headedness, numbness and headaches.  Hematological: Denies adenopathy. Easy bruising, personal or family bleeding history  Psychiatric/Behavioral: Denies suicidal ideation, mood changes, confusion, nervousness, sleep disturbance and agitation    Physical Exam: Vitals:   10/17/22 1427  BP: 124/84  Pulse: 70  Temp: 98.3 F (36.8 C)  TempSrc: Oral  SpO2: 100%  Weight:  246 lb 12.8 oz (111.9 kg)    Body mass index is 34.91 kg/m.   Constitutional: NAD, calm, comfortable Eyes: PERRL, lids and conjunctivae normal ENMT: Mucous membranes are moist. Musculoskeletal: There is inflammation of the left index finger MCP joint. Psychiatric: Normal judgment and insight. Alert and oriented x 3. Normal mood.    Impression and Plan:  Acute gout of right hand, unspecified cause - Plan: predniSONE (STERAPRED UNI-PAK 21 TAB) 10 MG (21) TBPK tablet  -Continued gout flare despite colchicine, will treat with prednisone taper.  Time spent:30 minutes reviewing chart, interviewing and examining patient and formulating plan of care.     [name], MD [facility]

## 2022-10-24 ENCOUNTER — Other Ambulatory Visit: Payer: Self-pay | Admitting: Internal Medicine

## 2022-10-24 DIAGNOSIS — E782 Mixed hyperlipidemia: Secondary | ICD-10-CM

## 2022-11-01 ENCOUNTER — Other Ambulatory Visit: Payer: Self-pay | Admitting: Internal Medicine

## 2022-11-01 DIAGNOSIS — I10 Essential (primary) hypertension: Secondary | ICD-10-CM

## 2022-11-01 DIAGNOSIS — M1A0720 Idiopathic chronic gout, left ankle and foot, without tophus (tophi): Secondary | ICD-10-CM

## 2023-01-06 ENCOUNTER — Ambulatory Visit: Payer: BC Managed Care – PPO | Admitting: Internal Medicine

## 2023-01-06 DIAGNOSIS — E1169 Type 2 diabetes mellitus with other specified complication: Secondary | ICD-10-CM

## 2023-01-22 ENCOUNTER — Encounter: Payer: Self-pay | Admitting: Internal Medicine

## 2023-01-22 ENCOUNTER — Ambulatory Visit: Payer: BC Managed Care – PPO | Admitting: Internal Medicine

## 2023-01-22 VITALS — BP 121/87 | HR 80 | Temp 98.3°F | Wt 251.4 lb

## 2023-01-22 DIAGNOSIS — I10 Essential (primary) hypertension: Secondary | ICD-10-CM

## 2023-01-22 DIAGNOSIS — M1A0720 Idiopathic chronic gout, left ankle and foot, without tophus (tophi): Secondary | ICD-10-CM | POA: Diagnosis not present

## 2023-01-22 DIAGNOSIS — E782 Mixed hyperlipidemia: Secondary | ICD-10-CM

## 2023-01-22 DIAGNOSIS — E1169 Type 2 diabetes mellitus with other specified complication: Secondary | ICD-10-CM | POA: Diagnosis not present

## 2023-01-22 LAB — POCT GLYCOSYLATED HEMOGLOBIN (HGB A1C): Hemoglobin A1C: 6.9 % — AB (ref 4.0–5.6)

## 2023-01-22 NOTE — Progress Notes (Signed)
Established Patient Office Visit     CC/Reason for Visit: Follow-up chronic conditions  HPI: Bill [name] is a 57 y.o. male who is coming in today for the above mentioned reasons. Past Medical History is significant for: Hypertension, hyperlipidemia, type 2 diabetes, gout, obesity.  He has been feeling well and has no acute concerns or complaints.  Has been adherent to medical therapy.   Past Medical/Surgical History: Past Medical History:  Diagnosis Date   Arthritis    left leg   Diabetes mellitus without complication ([place])    TYPE 2    GERD (gastroesophageal reflux disease)    Gout    Hypertension     Past Surgical History:  Procedure Laterality Date   DG OPERATIVE RIGHT HIP ([place] HX)     TOTAL HIP ARTHROPLASTY Right 10/16/2020   Procedure: RIGHT POSTERIOR TOTAL HIP ARTHROPLASTY AND REMOVAL OF HARDWARE;  Surgeon: Frederik Pear, MD;  Location: [facility];  Service: Orthopedics;  Laterality: Right;    Social History:  reports that he has never smoked. He has never used smokeless tobacco. He reports that he does not currently use alcohol. He reports that he does not use drugs.  Allergies: Allergies  Allergen Reactions   Dog Epithelium     Dog hair and dander    Family History:  Family History  Problem Relation Age of Onset   Diabetes Father    Colon cancer Neg Hx    Esophageal cancer Neg Hx    Rectal cancer Neg Hx    Stomach cancer Neg Hx      Current Outpatient Medications:    allopurinol (ZYLOPRIM) 100 MG tablet, TAKE 1 TABLET BY MOUTH EVERY DAY, Disp: 90 tablet, Rfl: 1   amLODipine (NORVASC) 5 MG tablet, Take 1 tablet (5 mg total) by mouth daily., Disp: 90 tablet, Rfl: 1   atorvastatin (LIPITOR) 40 MG tablet, TAKE 1 TABLET BY MOUTH EVERY DAY, Disp: 90 tablet, Rfl: 1   atorvastatin (LIPITOR) 80 MG tablet, Take 1 tablet (80 mg total) by mouth daily., Disp: 90 tablet, Rfl: 1   carvedilol (COREG) 3.125 MG tablet, TAKE 1 TABLET BY MOUTH TWICE A DAY WITH A MEAL,  Disp: 180 tablet, Rfl: 1   Continuous Blood Gluc Receiver (FREESTYLE LIBRE 2 READER) DEVI, 1 each by Does not apply route daily., Disp: 1 each, Rfl: 2   Continuous Blood Gluc Sensor (FREESTYLE LIBRE 2 SENSOR) MISC, 1 each by Does not apply route daily., Disp: 1 each, Rfl: 12   ezetimibe (ZETIA) 10 MG tablet, Take 1 tablet (10 mg total) by mouth daily., Disp: 90 tablet, Rfl: 1   pantoprazole (PROTONIX) 40 MG tablet, TAKE 1 TABLET BY MOUTH EVERY DAY (Patient taking differently: Take 40 mg by mouth daily.), Disp: 90 tablet, Rfl: 1   predniSONE (STERAPRED UNI-PAK 21 TAB) 10 MG (21) TBPK tablet, Take as directed, Disp: 21 tablet, Rfl: 0   valsartan (DIOVAN) 320 MG tablet, TAKE 1 TABLET BY MOUTH EVERY DAY, Disp: 90 tablet, Rfl: 1  Review of Systems:  Negative unless indicated in HPI.   Physical Exam: Vitals:   01/22/23 1112  BP: 121/87  Pulse: 80  Temp: 98.3 F (36.8 C)  TempSrc: Oral  SpO2: 98%  Weight: 251 lb 6.4 oz (114 kg)    Body mass index is 35.56 kg/m.   Physical Exam Vitals reviewed.  Constitutional:      Appearance: Normal appearance.  HENT:     Head: Normocephalic and atraumatic.  Eyes:     Conjunctiva/sclera: Conjunctivae normal.     Pupils: Pupils are equal, round, and reactive to light.  Cardiovascular:     Rate and Rhythm: Normal rate and regular rhythm.  Pulmonary:     Effort: Pulmonary effort is normal.     Breath sounds: Normal breath sounds.  Skin:    General: Skin is warm and dry.  Neurological:     General: No focal deficit present.     Mental Status: He is alert and oriented to person, place, and time.  Psychiatric:        Mood and Affect: Mood normal.        Behavior: Behavior normal.        Thought Content: Thought content normal.        Judgment: Judgment normal.      Impression and Plan:  Type 2 diabetes mellitus with other specified complication, without long-term current use of insulin (HCC) - Plan: POC HgB A1c  Mixed  hyperlipidemia  Primary hypertension  Idiopathic chronic gout of left ankle without tophus  -A1c continues to show good diabetic control at 6.9. -Blood pressure is fairly well-controlled. -He continues atorvastatin for hyperlipidemia. -He is due for labs at next visit.  Time spent:31 minutes reviewing chart, interviewing and examining patient and formulating plan of care.     [name], MD [facility] at [facility]

## 2023-03-17 ENCOUNTER — Other Ambulatory Visit: Payer: BC Managed Care – PPO

## 2023-03-27 ENCOUNTER — Other Ambulatory Visit: Payer: Self-pay | Admitting: Internal Medicine

## 2023-03-27 ENCOUNTER — Encounter: Payer: Self-pay | Admitting: Internal Medicine

## 2023-03-27 DIAGNOSIS — M1A0720 Idiopathic chronic gout, left ankle and foot, without tophus (tophi): Secondary | ICD-10-CM

## 2023-03-27 MED ORDER — COLCHICINE 0.6 MG PO TABS: 0.6000 mg | ORAL_TABLET | Freq: Every day | ORAL | 0 refills | Status: DC

## 2023-03-27 NOTE — Telephone Encounter (Signed)
Not on current medication list. Okay to fill?

## 2023-04-03 ENCOUNTER — Other Ambulatory Visit: Payer: Self-pay | Admitting: Internal Medicine

## 2023-04-03 DIAGNOSIS — I10 Essential (primary) hypertension: Secondary | ICD-10-CM

## 2023-04-12 ENCOUNTER — Encounter: Payer: Self-pay | Admitting: Internal Medicine

## 2023-05-22 ENCOUNTER — Ambulatory Visit (INDEPENDENT_AMBULATORY_CARE_PROVIDER_SITE_OTHER): Payer: BC Managed Care – PPO | Admitting: Internal Medicine

## 2023-05-22 ENCOUNTER — Encounter: Payer: Self-pay | Admitting: Internal Medicine

## 2023-05-22 VITALS — BP 120/70 | HR 85 | Temp 98.1°F | Ht 70.47 in | Wt 244.4 lb

## 2023-05-22 DIAGNOSIS — Z0000 Encounter for general adult medical examination without abnormal findings: Secondary | ICD-10-CM

## 2023-05-22 DIAGNOSIS — E559 Vitamin D deficiency, unspecified: Secondary | ICD-10-CM | POA: Diagnosis not present

## 2023-05-22 DIAGNOSIS — E782 Mixed hyperlipidemia: Secondary | ICD-10-CM

## 2023-05-22 DIAGNOSIS — E1169 Type 2 diabetes mellitus with other specified complication: Secondary | ICD-10-CM | POA: Diagnosis not present

## 2023-05-22 DIAGNOSIS — E669 Obesity, unspecified: Secondary | ICD-10-CM

## 2023-05-22 DIAGNOSIS — I10 Essential (primary) hypertension: Secondary | ICD-10-CM | POA: Diagnosis not present

## 2023-05-22 DIAGNOSIS — M1A0720 Idiopathic chronic gout, left ankle and foot, without tophus (tophi): Secondary | ICD-10-CM

## 2023-05-22 DIAGNOSIS — Z1159 Encounter for screening for other viral diseases: Secondary | ICD-10-CM

## 2023-05-22 DIAGNOSIS — M109 Gout, unspecified: Secondary | ICD-10-CM

## 2023-05-22 LAB — COMPREHENSIVE METABOLIC PANEL
ALT: 22 U/L (ref 0–53)
AST: 39 U/L — ABNORMAL HIGH (ref 0–37)
Albumin: 4.5 g/dL (ref 3.5–5.2)
Alkaline Phosphatase: 69 U/L (ref 39–117)
BUN: 25 mg/dL — ABNORMAL HIGH (ref 6–23)
CO2: 24 mEq/L (ref 19–32)
Calcium: 10 mg/dL (ref 8.4–10.5)
Chloride: 103 mEq/L (ref 96–112)
Creatinine, Ser: 1.43 mg/dL (ref 0.40–1.50)
GFR: 54.68 mL/min — ABNORMAL LOW (ref >60.00)
Glucose, Bld: 134 mg/dL — ABNORMAL HIGH (ref 70–99)
Potassium: 4.4 mEq/L (ref 3.5–5.1)
Sodium: 137 mEq/L (ref 135–145)
Total Bilirubin: 1.1 mg/dL (ref 0.2–1.2)
Total Protein: 8.4 g/dL — ABNORMAL HIGH (ref 6.0–8.3)

## 2023-05-22 LAB — CBC WITH DIFFERENTIAL/PLATELET
Basophils Absolute: 0 10*3/uL (ref 0.0–0.1)
Basophils Relative: 0.7 % (ref 0.0–3.0)
Eosinophils Absolute: 0.3 10*3/uL (ref 0.0–0.7)
Eosinophils Relative: 4.7 % (ref 0.0–5.0)
HCT: 40.4 % (ref 39.0–52.0)
Hemoglobin: 13.4 g/dL (ref 13.0–17.0)
Lymphocytes Relative: 47.5 % — ABNORMAL HIGH (ref 12.0–46.0)
Lymphs Abs: 2.9 10*3/uL (ref 0.7–4.0)
MCHC: 33.3 g/dL (ref 30.0–36.0)
MCV: 93.6 fl (ref 78.0–100.0)
Monocytes Absolute: 0.3 10*3/uL (ref 0.1–1.0)
Monocytes Relative: 5.3 % (ref 3.0–12.0)
Neutro Abs: 2.5 10*3/uL (ref 1.4–7.7)
Neutrophils Relative %: 41.8 % — ABNORMAL LOW (ref 43.0–77.0)
Platelets: 259 10*3/uL (ref 150.0–400.0)
RBC: 4.32 Mil/uL (ref 4.22–5.81)
RDW: 14.3 % (ref 11.5–15.5)
WBC: 6 10*3/uL (ref 4.0–10.5)

## 2023-05-22 LAB — MICROALBUMIN / CREATININE URINE RATIO
Creatinine,U: 118.2 mg/dL
Microalb Creat Ratio: 0.6 mg/g (ref 0.0–30.0)
Microalb, Ur: <0.7 mg/dL (ref 0.0–1.9)

## 2023-05-22 LAB — TSH: TSH: 2.45 u[IU]/mL (ref 0.35–5.50)

## 2023-05-22 LAB — VITAMIN D 25 HYDROXY (VIT D DEFICIENCY, FRACTURES): VITD: 19.84 ng/mL — ABNORMAL LOW (ref 30.00–100.00)

## 2023-05-22 LAB — PSA: PSA: 0.41 ng/mL (ref 0.10–4.00)

## 2023-05-22 LAB — LIPID PANEL
Cholesterol: 231 mg/dL — ABNORMAL HIGH (ref 0–200)
HDL: 41.2 mg/dL (ref >39.00)
LDL Cholesterol: 163 mg/dL — ABNORMAL HIGH (ref 0–99)
NonHDL: 189.45
Total CHOL/HDL Ratio: 6
Triglycerides: 134 mg/dL (ref 0.0–149.0)
VLDL: 26.8 mg/dL (ref 0.0–40.0)

## 2023-05-22 LAB — VITAMIN B12: Vitamin B-12: 396 pg/mL (ref 211–911)

## 2023-05-22 LAB — HEMOGLOBIN A1C: Hgb A1c MFr Bld: 6.9 % — ABNORMAL HIGH (ref 4.6–6.5)

## 2023-05-22 MED ORDER — INDOMETHACIN 50 MG PO CAPS: 50.0000 mg | ORAL_CAPSULE | Freq: Three times a day (TID) | ORAL | 0 refills | Status: DC | PRN

## 2023-05-22 MED ORDER — ALLOPURINOL 100 MG PO TABS
100.0000 mg | ORAL_TABLET | Freq: Every day | ORAL | 1 refills | Status: DC
Start: 2023-05-22 — End: 2023-09-23

## 2023-05-22 NOTE — Progress Notes (Signed)
Established Patient Office Visit     CC/Reason for Visit: Annual preventive exam and follow-up chronic conditions  HPI: Bill [name] is a 57 y.o. male who is coming in today for the above mentioned reasons. Past Medical History is significant for: Hypertension, hyperlipidemia, type 2 diabetes, gout, obesity.  He has been feeling well other than going through an acute gout flareup.  He is requesting indomethacin as colchicine causes stomach upset and he feels it works better.  He discontinued allopurinol a while back.  All immunizations and cancer screenings are up-to-date.  He has routine eye care but is overdue for dental exam.   Past Medical/Surgical History: Past Medical History:  Diagnosis Date   Arthritis    left leg   Diabetes mellitus without complication (HCC)    TYPE 2    GERD (gastroesophageal reflux disease)    Gout    Hypertension     Past Surgical History:  Procedure Laterality Date   DG OPERATIVE RIGHT HIP ([facility] HX)     TOTAL HIP ARTHROPLASTY Right 10/16/2020   Procedure: RIGHT POSTERIOR TOTAL HIP ARTHROPLASTY AND REMOVAL OF HARDWARE;  Surgeon: Gean Birchwood, MD;  Location: [facility];  Service: Orthopedics;  Laterality: Right;    Social History:  reports that he has never smoked. He has never used smokeless tobacco. He reports that he does not currently use alcohol. He reports that he does not use drugs.  Allergies: Allergies  Allergen Reactions   Dog Epithelium     Dog hair and dander    Family History:  Family History  Problem Relation Age of Onset   Diabetes Father    Colon cancer Neg Hx    Esophageal cancer Neg Hx    Rectal cancer Neg Hx    Stomach cancer Neg Hx      Current Outpatient Medications:    amLODipine (NORVASC) 5 MG tablet, TAKE 1 TABLET (5 MG TOTAL) BY MOUTH DAILY., Disp: 90 tablet, Rfl: 1   atorvastatin (LIPITOR) 40 MG tablet, TAKE 1 TABLET BY MOUTH EVERY DAY, Disp: 90 tablet, Rfl: 1   atorvastatin (LIPITOR) 80 MG tablet, Take 1  tablet (80 mg total) by mouth daily., Disp: 90 tablet, Rfl: 1   carvedilol (COREG) 3.125 MG tablet, TAKE 1 TABLET BY MOUTH TWICE A DAY WITH A MEAL, Disp: 180 tablet, Rfl: 1   Continuous Blood Gluc Receiver (FREESTYLE LIBRE 2 READER) DEVI, 1 each by Does not apply route daily., Disp: 1 each, Rfl: 2   Continuous Blood Gluc Sensor (FREESTYLE LIBRE 2 SENSOR) MISC, 1 each by Does not apply route daily., Disp: 1 each, Rfl: 12   ezetimibe (ZETIA) 10 MG tablet, Take 1 tablet (10 mg total) by mouth daily., Disp: 90 tablet, Rfl: 1   indomethacin (INDOCIN) 50 MG capsule, Take 1 capsule (50 mg total) by mouth 3 (three) times daily as needed (Gout pain)., Disp: 90 capsule, Rfl: 0   pantoprazole (PROTONIX) 40 MG tablet, TAKE 1 TABLET BY MOUTH EVERY DAY (Patient taking differently: Take 40 mg by mouth daily.), Disp: 90 tablet, Rfl: 1   predniSONE (STERAPRED UNI-PAK 21 TAB) 10 MG (21) TBPK tablet, Take as directed, Disp: 21 tablet, Rfl: 0   valsartan (DIOVAN) 320 MG tablet, TAKE 1 TABLET BY MOUTH EVERY DAY, Disp: 90 tablet, Rfl: 1   allopurinol (ZYLOPRIM) 100 MG tablet, Take 1 tablet (100 mg total) by mouth daily., Disp: 90 tablet, Rfl: 1  Review of Systems:  Negative unless indicated in HPI.  Physical Exam: Vitals:   05/22/23 1056  BP: 120/70  Pulse: 85  Temp: 98.1 F (36.7 C)  TempSrc: Oral  SpO2: 99%  Weight: 244 lb 6.4 oz (110.9 kg)  Height: 5' 10.47" (1.79 m)    Body mass index is 34.6 kg/m.   Physical Exam Vitals reviewed.  Constitutional:      General: He is not in acute distress.    Appearance: Normal appearance. He is not ill-appearing, toxic-appearing or diaphoretic.  HENT:     Head: Normocephalic.     Right Ear: Tympanic membrane, ear canal and external ear normal. There is no impacted cerumen.     Left Ear: Tympanic membrane, ear canal and external ear normal. There is no impacted cerumen.     Nose: Nose normal.     Mouth/Throat:     Mouth: Mucous membranes are moist.      Pharynx: Oropharynx is clear. No oropharyngeal exudate or posterior oropharyngeal erythema.  Eyes:     General: No scleral icterus.       Right eye: No discharge.        Left eye: No discharge.     Conjunctiva/sclera: Conjunctivae normal.     Pupils: Pupils are equal, round, and reactive to light.  Neck:     Vascular: No carotid bruit.  Cardiovascular:     Rate and Rhythm: Normal rate and regular rhythm.     Pulses: Normal pulses.     Heart sounds: Normal heart sounds.  Pulmonary:     Effort: Pulmonary effort is normal. No respiratory distress.     Breath sounds: Normal breath sounds.  Abdominal:     General: Abdomen is flat. Bowel sounds are normal.     Palpations: Abdomen is soft.  Musculoskeletal:        General: Normal range of motion.     Cervical back: Normal range of motion.  Skin:    General: Skin is warm and dry.  Neurological:     General: No focal deficit present.     Mental Status: He is alert and oriented to person, place, and time. Mental status is at baseline.  Psychiatric:        Mood and Affect: Mood normal.        Behavior: Behavior normal.        Thought Content: Thought content normal.        Judgment: Judgment normal.      Impression and Plan:  Encounter for preventive health examination -     PSA; Future -     Hepatitis C antibody; Future  Type 2 diabetes mellitus with other specified complication, without long-term current use of insulin (HCC) -     CBC with Differential/Platelet; Future -     Comprehensive metabolic panel; Future -     Lipid panel; Future -     Hemoglobin A1c; Future -     Microalbumin / creatinine urine ratio; Future  Mixed hyperlipidemia  Primary hypertension  Vitamin D deficiency -     VITAMIN D 25 Hydroxy (Vit-D Deficiency, Fractures); Future  Idiopathic chronic gout of left ankle without tophus -     Allopurinol; Take 1 tablet (100 mg total) by mouth daily.  Dispense: 90 tablet; Refill: 1  Encounter for hepatitis  C screening test for low risk patient  Obesity (BMI 30.0-34.9) -     TSH; Future -     Vitamin B12; Future  Acute gout of left foot, unspecified cause -  Indomethacin; Take 1 capsule (50 mg total) by mouth 3 (three) times daily as needed (Gout pain).  Dispense: 90 capsule; Refill: 0  -Recommend routine eye and dental care. -Healthy lifestyle discussed in detail. -Labs to be updated today. -Prostate cancer screening: PSA today Health Maintenance  Topic Date Due   Hepatitis C Screening  Never done   COVID-19 Vaccine (4 - 2023-24 season) 07/05/2022   Yearly kidney health urinalysis for diabetes  04/10/2023   Yearly kidney function blood test for diabetes  06/04/2023   Flu Shot  06/05/2023   Hemoglobin A1C  07/25/2023   Eye exam for diabetics  07/30/2023   Complete foot exam   05/21/2024   Colon Cancer Screening  01/11/2030   DTaP/Tdap/Td vaccine (2 - Td or Tdap) 07/11/2031   HIV Screening  Completed   Zoster (Shingles) Vaccine  Completed   HPV Vaccine  Aged Out     -For his gout have advised that he resume allopurinol as he has frequent gouty attacks.  Will switch colchicine to indomethacin per request and also because of GI upset with colchicine.    Chaya Jan, MD [facility] at [facility]

## 2023-05-23 LAB — HEPATITIS C ANTIBODY: Hepatitis C Ab: NONREACTIVE

## 2023-05-30 ENCOUNTER — Encounter: Payer: Self-pay | Admitting: Internal Medicine

## 2023-05-30 ENCOUNTER — Other Ambulatory Visit: Payer: Self-pay | Admitting: Internal Medicine

## 2023-05-30 DIAGNOSIS — N183 Chronic kidney disease, stage 3 unspecified: Secondary | ICD-10-CM | POA: Insufficient documentation

## 2023-05-30 DIAGNOSIS — E559 Vitamin D deficiency, unspecified: Secondary | ICD-10-CM

## 2023-05-30 DIAGNOSIS — E782 Mixed hyperlipidemia: Secondary | ICD-10-CM

## 2023-05-30 DIAGNOSIS — R7401 Elevation of levels of liver transaminase levels: Secondary | ICD-10-CM | POA: Insufficient documentation

## 2023-05-30 MED ORDER — VITAMIN D (ERGOCALCIFEROL) 1.25 MG (50000 UNIT) PO CAPS: 50000.0000 [IU] | ORAL_CAPSULE | ORAL | 0 refills | Status: AC

## 2023-06-04 ENCOUNTER — Encounter: Payer: Self-pay | Admitting: Internal Medicine

## 2023-06-27 ENCOUNTER — Encounter: Payer: Self-pay | Admitting: Internal Medicine

## 2023-06-27 DIAGNOSIS — E782 Mixed hyperlipidemia: Secondary | ICD-10-CM

## 2023-06-30 MED ORDER — EZETIMIBE 10 MG PO TABS: 10.0000 mg | ORAL_TABLET | Freq: Every day | ORAL | 1 refills | Status: AC

## 2023-06-30 MED ORDER — ATORVASTATIN CALCIUM 80 MG PO TABS
80.0000 mg | ORAL_TABLET | Freq: Every day | ORAL | 1 refills | Status: DC
Start: 2023-06-30 — End: 2023-12-03

## 2023-07-30 ENCOUNTER — Encounter (INDEPENDENT_AMBULATORY_CARE_PROVIDER_SITE_OTHER): Payer: BC Managed Care – PPO | Admitting: Ophthalmology

## 2023-08-19 ENCOUNTER — Other Ambulatory Visit: Payer: Self-pay | Admitting: Internal Medicine

## 2023-08-19 DIAGNOSIS — E559 Vitamin D deficiency, unspecified: Secondary | ICD-10-CM

## 2023-09-23 ENCOUNTER — Ambulatory Visit (INDEPENDENT_AMBULATORY_CARE_PROVIDER_SITE_OTHER): Payer: BC Managed Care – PPO | Admitting: Internal Medicine

## 2023-09-23 ENCOUNTER — Encounter: Payer: Self-pay | Admitting: Internal Medicine

## 2023-09-23 VITALS — BP 130/80 | HR 82 | Temp 98.1°F | Wt 239.0 lb

## 2023-09-23 DIAGNOSIS — N1831 Chronic kidney disease, stage 3a: Secondary | ICD-10-CM | POA: Diagnosis not present

## 2023-09-23 DIAGNOSIS — E782 Mixed hyperlipidemia: Secondary | ICD-10-CM | POA: Diagnosis not present

## 2023-09-23 DIAGNOSIS — I10 Essential (primary) hypertension: Secondary | ICD-10-CM | POA: Diagnosis not present

## 2023-09-23 DIAGNOSIS — E1169 Type 2 diabetes mellitus with other specified complication: Secondary | ICD-10-CM | POA: Diagnosis not present

## 2023-09-23 DIAGNOSIS — M1A0720 Idiopathic chronic gout, left ankle and foot, without tophus (tophi): Secondary | ICD-10-CM

## 2023-09-23 LAB — POCT GLYCOSYLATED HEMOGLOBIN (HGB A1C): Hemoglobin A1C: 6.8 % — AB (ref 4.0–5.6)

## 2023-09-23 MED ORDER — ALLOPURINOL 300 MG PO TABS: 300.0000 mg | ORAL_TABLET | Freq: Every day | ORAL | 1 refills | Status: DC

## 2023-09-23 NOTE — Assessment & Plan Note (Signed)
 Fair control on current.

## 2023-09-23 NOTE — Assessment & Plan Note (Signed)
Most recently had a gouty flareup of his second right MCP joint.  This required an urgent care visit for prednisone as indomethacin was not working.  He discontinued allopurinol a while back.  We will restart at a higher dose of 300 mg daily.  He has frequent gout flares.

## 2023-09-23 NOTE — Assessment & Plan Note (Signed)
Well controlled with an A1c of 6.8.

## 2023-09-23 NOTE — Assessment & Plan Note (Signed)
Not well-controlled on atorvastatin 80 mg.  Add ezetimibe 10 mg daily and recheck in 3 months.

## 2023-09-23 NOTE — Progress Notes (Signed)
Established Patient Office Visit     CC/Reason for Visit: Follow-up chronic conditions  HPI: Bill [name] is a 57 y.o. male who is coming in today for the above mentioned reasons. Past Medical History is significant for: Hypertension, hyperlipidemia, type 2 diabetes, obesity, gout.  Feeling well other than a recent gout flareup of his right second MCP joint.  He did take prednisone for this.  He is otherwise feeling well.   Past Medical/Surgical History: Past Medical History:  Diagnosis Date   Arthritis    left leg   Diabetes mellitus without complication (HCC)    TYPE 2    GERD (gastroesophageal reflux disease)    Gout    Hypertension     Past Surgical History:  Procedure Laterality Date   DG OPERATIVE RIGHT HIP ([facility] HX)     TOTAL HIP ARTHROPLASTY Right 10/16/2020   Procedure: RIGHT POSTERIOR TOTAL HIP ARTHROPLASTY AND REMOVAL OF HARDWARE;  Surgeon: Gean Birchwood, MD;  Location: [facility];  Service: Orthopedics;  Laterality: Right;    Social History:  reports that he has never smoked. He has never used smokeless tobacco. He reports that he does not currently use alcohol. He reports that he does not use drugs.  Allergies: Allergies  Allergen Reactions   Dog Epithelium     Dog hair and dander    Family History:  Family History  Problem Relation Age of Onset   Diabetes Father    Colon cancer Neg Hx    Esophageal cancer Neg Hx    Rectal cancer Neg Hx    Stomach cancer Neg Hx      Current Outpatient Medications:    allopurinol (ZYLOPRIM) 300 MG tablet, Take 1 tablet (300 mg total) by mouth daily., Disp: 90 tablet, Rfl: 1   amLODipine (NORVASC) 5 MG tablet, TAKE 1 TABLET (5 MG TOTAL) BY MOUTH DAILY., Disp: 90 tablet, Rfl: 1   atorvastatin (LIPITOR) 80 MG tablet, Take 1 tablet (80 mg total) by mouth daily., Disp: 90 tablet, Rfl: 1   carvedilol (COREG) 3.125 MG tablet, TAKE 1 TABLET BY MOUTH TWICE A DAY WITH A MEAL, Disp: 180 tablet, Rfl: 1   Continuous Blood  Gluc Receiver (FREESTYLE LIBRE 2 READER) DEVI, 1 each by Does not apply route daily., Disp: 1 each, Rfl: 2   Continuous Blood Gluc Sensor (FREESTYLE LIBRE 2 SENSOR) MISC, 1 each by Does not apply route daily., Disp: 1 each, Rfl: 12   ezetimibe (ZETIA) 10 MG tablet, Take 1 tablet (10 mg total) by mouth daily., Disp: 90 tablet, Rfl: 1   indomethacin (INDOCIN) 50 MG capsule, Take 1 capsule (50 mg total) by mouth 3 (three) times daily as needed (Gout pain)., Disp: 90 capsule, Rfl: 0   pantoprazole (PROTONIX) 40 MG tablet, TAKE 1 TABLET BY MOUTH EVERY DAY (Patient taking differently: Take 40 mg by mouth daily.), Disp: 90 tablet, Rfl: 1   predniSONE (STERAPRED UNI-PAK 21 TAB) 10 MG (21) TBPK tablet, Take as directed, Disp: 21 tablet, Rfl: 0   valsartan (DIOVAN) 320 MG tablet, TAKE 1 TABLET BY MOUTH EVERY DAY, Disp: 90 tablet, Rfl: 1  Review of Systems:  Negative unless indicated in HPI.   Physical Exam: Vitals:   09/23/23 1146  BP: 130/80  Pulse: 82  Temp: 98.1 F (36.7 C)  TempSrc: Oral  SpO2: 98%  Weight: 239 lb (108.4 kg)    Body mass index is 33.84 kg/m.   Physical Exam Vitals reviewed.  Constitutional:  Appearance: Normal appearance.  HENT:     Head: Normocephalic and atraumatic.  Eyes:     Conjunctiva/sclera: Conjunctivae normal.     Pupils: Pupils are equal, round, and reactive to light.  Cardiovascular:     Rate and Rhythm: Normal rate and regular rhythm.  Pulmonary:     Effort: Pulmonary effort is normal.     Breath sounds: Normal breath sounds.  Skin:    General: Skin is warm and dry.  Neurological:     General: No focal deficit present.     Mental Status: He is alert and oriented to person, place, and time.  Psychiatric:        Mood and Affect: Mood normal.        Behavior: Behavior normal.        Thought Content: Thought content normal.        Judgment: Judgment normal.      Impression and Plan:  Type 2 diabetes mellitus with other specified  complication, without long-term current use of insulin (HCC) Assessment & Plan: Well-controlled with an A1c of 6.8.  Orders: -     POCT glycosylated hemoglobin (Hb A1C)  Stage 3a chronic kidney disease (HCC) Assessment & Plan: Baseline creatinine is between 1.3 and 1.5.   Primary hypertension Assessment & Plan: Fair control on current.   Mixed hyperlipidemia Assessment & Plan: Not well-controlled on atorvastatin 80 mg.  Add ezetimibe 10 mg daily and recheck in 3 months.   Idiopathic chronic gout of left ankle without tophus Assessment & Plan: Most recently had a gouty flareup of his second right MCP joint.  This required an urgent care visit for prednisone as indomethacin was not working.  He discontinued allopurinol a while back.  We will restart at a higher dose of 300 mg daily.  He has frequent gout flares.  Orders: -     Allopurinol; Take 1 tablet (300 mg total) by mouth daily.  Dispense: 90 tablet; Refill: 1     Time spent:33 minutes reviewing chart, interviewing and examining patient and formulating plan of care.     Chaya Jan, MD [facility] at [facility]

## 2023-09-23 NOTE — Assessment & Plan Note (Signed)
Baseline creatinine is between 1.3 and 1.5.

## 2023-10-06 ENCOUNTER — Encounter: Payer: Self-pay | Admitting: Internal Medicine

## 2023-10-06 DIAGNOSIS — M109 Gout, unspecified: Secondary | ICD-10-CM

## 2023-10-06 MED ORDER — INDOMETHACIN 50 MG PO CAPS: 50.0000 mg | ORAL_CAPSULE | Freq: Three times a day (TID) | ORAL | 0 refills | Status: DC | PRN

## 2023-10-13 ENCOUNTER — Other Ambulatory Visit: Payer: Self-pay | Admitting: Internal Medicine

## 2023-10-13 DIAGNOSIS — M109 Gout, unspecified: Secondary | ICD-10-CM

## 2023-10-24 ENCOUNTER — Other Ambulatory Visit: Payer: Self-pay | Admitting: Internal Medicine

## 2023-10-24 DIAGNOSIS — M109 Gout, unspecified: Secondary | ICD-10-CM

## 2023-11-27 ENCOUNTER — Encounter: Payer: Self-pay | Admitting: Internal Medicine

## 2023-11-27 DIAGNOSIS — I10 Essential (primary) hypertension: Secondary | ICD-10-CM

## 2023-12-01 MED ORDER — AMLODIPINE BESYLATE 5 MG PO TABS: 5.0000 mg | ORAL_TABLET | Freq: Every day | ORAL | 1 refills | Status: AC

## 2023-12-01 MED ORDER — VALSARTAN 320 MG PO TABS: 320.0000 mg | ORAL_TABLET | Freq: Every day | ORAL | 1 refills | Status: DC

## 2023-12-03 ENCOUNTER — Other Ambulatory Visit: Payer: Self-pay | Admitting: Internal Medicine

## 2023-12-03 DIAGNOSIS — E782 Mixed hyperlipidemia: Secondary | ICD-10-CM

## 2023-12-03 DIAGNOSIS — M109 Gout, unspecified: Secondary | ICD-10-CM

## 2023-12-24 ENCOUNTER — Ambulatory Visit: Payer: 59 | Admitting: Internal Medicine

## 2023-12-31 ENCOUNTER — Ambulatory Visit: Payer: 59 | Admitting: Internal Medicine

## 2024-01-01 ENCOUNTER — Ambulatory Visit: Payer: 59 | Admitting: Internal Medicine

## 2024-01-01 ENCOUNTER — Encounter: Payer: Self-pay | Admitting: Internal Medicine

## 2024-01-01 VITALS — BP 110/80 | HR 79 | Temp 98.3°F | Wt 234.7 lb

## 2024-01-01 DIAGNOSIS — G5601 Carpal tunnel syndrome, right upper limb: Secondary | ICD-10-CM | POA: Diagnosis not present

## 2024-01-01 DIAGNOSIS — E1169 Type 2 diabetes mellitus with other specified complication: Secondary | ICD-10-CM | POA: Diagnosis not present

## 2024-01-01 DIAGNOSIS — M25562 Pain in left knee: Secondary | ICD-10-CM | POA: Diagnosis not present

## 2024-01-01 LAB — POCT GLYCOSYLATED HEMOGLOBIN (HGB A1C): Hemoglobin A1C: 6.4 % — AB (ref 4.0–5.6)

## 2024-01-01 MED ORDER — MELOXICAM 7.5 MG PO TABS: 7.5000 mg | ORAL_TABLET | Freq: Every day | ORAL | 0 refills | Status: DC

## 2024-01-01 NOTE — Progress Notes (Signed)
 Established Patient Office Visit     CC/Reason for Visit: Discuss chronic and acute concerns  HPI: Bill [name] is a 58 y.o. male who is coming in today for the above mentioned reasons. Past Medical History is significant for: Hypertension, hyperlipidemia, type 2 diabetes, gout, obesity.  He is feeling well.  He has 2 major issues he would like to discuss today:  1.  Has been having tingling of his right thumb index middle and ring finger.  2.  Chronic pain of his left knee that is worsening lately.   Past Medical/Surgical History: Past Medical History:  Diagnosis Date   Arthritis    left leg   Diabetes mellitus without complication (HCC)    TYPE 2    GERD (gastroesophageal reflux disease)    Gout    Hypertension     Past Surgical History:  Procedure Laterality Date   DG OPERATIVE RIGHT HIP ([facility] HX)     TOTAL HIP ARTHROPLASTY Right 10/16/2020   Procedure: RIGHT POSTERIOR TOTAL HIP ARTHROPLASTY AND REMOVAL OF HARDWARE;  Surgeon: Gean Birchwood, MD;  Location: [facility];  Service: Orthopedics;  Laterality: Right;    Social History:  reports that he has never smoked. He has never used smokeless tobacco. He reports that he does not currently use alcohol. He reports that he does not use drugs.  Allergies: Allergies  Allergen Reactions   Dog Epithelium     Dog hair and dander    Family History:  Family History  Problem Relation Age of Onset   Diabetes Father    Colon cancer Neg Hx    Esophageal cancer Neg Hx    Rectal cancer Neg Hx    Stomach cancer Neg Hx      Current Outpatient Medications:    allopurinol (ZYLOPRIM) 300 MG tablet, Take 1 tablet (300 mg total) by mouth daily., Disp: 90 tablet, Rfl: 1   amLODipine (NORVASC) 5 MG tablet, Take 1 tablet (5 mg total) by mouth daily., Disp: 90 tablet, Rfl: 1   atorvastatin (LIPITOR) 80 MG tablet, TAKE 1 TABLET BY MOUTH EVERY DAY, Disp: 90 tablet, Rfl: 0   carvedilol (COREG) 3.125 MG tablet, TAKE 1 TABLET BY MOUTH  TWICE A DAY WITH A MEAL, Disp: 180 tablet, Rfl: 1   Continuous Blood Gluc Receiver (FREESTYLE LIBRE 2 READER) DEVI, 1 each by Does not apply route daily., Disp: 1 each, Rfl: 2   Continuous Blood Gluc Sensor (FREESTYLE LIBRE 2 SENSOR) MISC, 1 each by Does not apply route daily., Disp: 1 each, Rfl: 12   ezetimibe (ZETIA) 10 MG tablet, Take 1 tablet (10 mg total) by mouth daily., Disp: 90 tablet, Rfl: 1   indomethacin (INDOCIN) 50 MG capsule, TAKE 1 CAPSULE(50 MG) BY MOUTH THREE TIMES DAILY AS NEEDED FOR GOUT PAIN, Disp: 90 capsule, Rfl: 0   meloxicam (MOBIC) 7.5 MG tablet, Take 1 tablet (7.5 mg total) by mouth daily., Disp: 30 tablet, Rfl: 0   pantoprazole (PROTONIX) 40 MG tablet, TAKE 1 TABLET BY MOUTH EVERY DAY (Patient taking differently: Take 40 mg by mouth daily.), Disp: 90 tablet, Rfl: 1   valsartan (DIOVAN) 320 MG tablet, Take 1 tablet (320 mg total) by mouth daily., Disp: 90 tablet, Rfl: 1  Review of Systems:  Negative unless indicated in HPI.   Physical Exam: Vitals:   01/01/24 1050  BP: 110/80  Pulse: 79  Temp: 98.3 F (36.8 C)  TempSrc: Oral  SpO2: 97%  Weight: 234 lb 11.2 oz (  106.5 kg)    Body mass index is 33.23 kg/m.   Physical Exam Vitals reviewed.  Constitutional:      Appearance: Normal appearance.  HENT:     Head: Normocephalic and atraumatic.  Eyes:     Conjunctiva/sclera: Conjunctivae normal.  Cardiovascular:     Rate and Rhythm: Normal rate and regular rhythm.  Pulmonary:     Effort: Pulmonary effort is normal.     Breath sounds: Normal breath sounds.  Skin:    General: Skin is warm and dry.  Neurological:     General: No focal deficit present.     Mental Status: He is alert and oriented to person, place, and time.  Psychiatric:        Mood and Affect: Mood normal.        Behavior: Behavior normal.        Thought Content: Thought content normal.        Judgment: Judgment normal.      Impression and Plan:  Type 2 diabetes mellitus with other  specified complication, without long-term current use of insulin (HCC) -     POCT glycosylated hemoglobin (Hb A1C)  Right carpal tunnel syndrome  Acute pain of left knee -     Meloxicam; Take 1 tablet (7.5 mg total) by mouth daily.  Dispense: 30 tablet; Refill: 0   -Blood pressure is well-controlled on current. -A1c of 6.4 demonstrates excellent diabetic control. -Suspect right carpal tunnel syndrome.  After explaining treatment modalities he has elected to try conservative therapy first.  He will get fitted for a wrist splint. -Left knee pain likely arthritis versus gout.  He will try bracing and meloxicam, if no improvement refer to orthopedics.  Time spent:31 minutes reviewing chart, interviewing and examining patient and formulating plan of care.     Chaya Jan, MD [facility] at [facility]

## 2024-01-09 ENCOUNTER — Other Ambulatory Visit: Payer: Self-pay | Admitting: Internal Medicine

## 2024-01-09 DIAGNOSIS — M109 Gout, unspecified: Secondary | ICD-10-CM

## 2024-02-03 ENCOUNTER — Other Ambulatory Visit: Payer: Self-pay | Admitting: Internal Medicine

## 2024-02-03 DIAGNOSIS — M25562 Pain in left knee: Secondary | ICD-10-CM

## 2024-02-27 ENCOUNTER — Other Ambulatory Visit: Payer: Self-pay | Admitting: Internal Medicine

## 2024-02-27 DIAGNOSIS — M1A0720 Idiopathic chronic gout, left ankle and foot, without tophus (tophi): Secondary | ICD-10-CM

## 2024-05-25 ENCOUNTER — Encounter: Payer: Self-pay | Admitting: Internal Medicine

## 2024-05-25 ENCOUNTER — Ambulatory Visit (INDEPENDENT_AMBULATORY_CARE_PROVIDER_SITE_OTHER): Payer: 59 | Admitting: Internal Medicine

## 2024-05-25 VITALS — BP 130/80 | HR 65 | Temp 98.3°F | Ht 70.0 in | Wt 250.3 lb

## 2024-05-25 DIAGNOSIS — Z0000 Encounter for general adult medical examination without abnormal findings: Secondary | ICD-10-CM

## 2024-05-25 DIAGNOSIS — E1169 Type 2 diabetes mellitus with other specified complication: Secondary | ICD-10-CM

## 2024-05-25 DIAGNOSIS — I10 Essential (primary) hypertension: Secondary | ICD-10-CM | POA: Diagnosis not present

## 2024-05-25 DIAGNOSIS — R7401 Elevation of levels of liver transaminase levels: Secondary | ICD-10-CM

## 2024-05-25 DIAGNOSIS — M1A0720 Idiopathic chronic gout, left ankle and foot, without tophus (tophi): Secondary | ICD-10-CM

## 2024-05-25 DIAGNOSIS — G5601 Carpal tunnel syndrome, right upper limb: Secondary | ICD-10-CM

## 2024-05-25 DIAGNOSIS — E559 Vitamin D deficiency, unspecified: Secondary | ICD-10-CM | POA: Diagnosis not present

## 2024-05-25 DIAGNOSIS — E782 Mixed hyperlipidemia: Secondary | ICD-10-CM

## 2024-05-25 LAB — COMPREHENSIVE METABOLIC PANEL WITH GFR
ALT: 16 U/L (ref 0–53)
AST: 33 U/L (ref 0–37)
Albumin: 4.1 g/dL (ref 3.5–5.2)
Alkaline Phosphatase: 77 U/L (ref 39–117)
BUN: 18 mg/dL (ref 6–23)
CO2: 26 meq/L (ref 19–32)
Calcium: 9.1 mg/dL (ref 8.4–10.5)
Chloride: 104 meq/L (ref 96–112)
Creatinine, Ser: 1.2 mg/dL (ref 0.40–1.50)
GFR: 67 mL/min (ref >60.00)
Glucose, Bld: 131 mg/dL — ABNORMAL HIGH (ref 70–99)
Potassium: 3.9 meq/L (ref 3.5–5.1)
Sodium: 136 meq/L (ref 135–145)
Total Bilirubin: 1.3 mg/dL — ABNORMAL HIGH (ref 0.2–1.2)
Total Protein: 7.5 g/dL (ref 6.0–8.3)

## 2024-05-25 LAB — CBC WITH DIFFERENTIAL/PLATELET
Basophils Absolute: 0 K/uL (ref 0.0–0.1)
Basophils Relative: 0.7 % (ref 0.0–3.0)
Eosinophils Absolute: 0.3 K/uL (ref 0.0–0.7)
Eosinophils Relative: 5.4 % — ABNORMAL HIGH (ref 0.0–5.0)
HCT: 36.1 % — ABNORMAL LOW (ref 39.0–52.0)
Hemoglobin: 12 g/dL — ABNORMAL LOW (ref 13.0–17.0)
Lymphocytes Relative: 45.5 % (ref 12.0–46.0)
Lymphs Abs: 2.7 K/uL (ref 0.7–4.0)
MCHC: 33.1 g/dL (ref 30.0–36.0)
MCV: 91.9 fl (ref 78.0–100.0)
Monocytes Absolute: 0.3 K/uL (ref 0.1–1.0)
Monocytes Relative: 5.8 % (ref 3.0–12.0)
Neutro Abs: 2.5 K/uL (ref 1.4–7.7)
Neutrophils Relative %: 42.6 % — ABNORMAL LOW (ref 43.0–77.0)
Platelets: 240 K/uL (ref 150.0–400.0)
RBC: 3.93 Mil/uL — ABNORMAL LOW (ref 4.22–5.81)
RDW: 14.9 % (ref 11.5–15.5)
WBC: 6 K/uL (ref 4.0–10.5)

## 2024-05-25 LAB — LIPID PANEL
Cholesterol: 174 mg/dL (ref 0–200)
HDL: 44 mg/dL (ref >39.00)
LDL Cholesterol: 107 mg/dL — ABNORMAL HIGH (ref 0–99)
NonHDL: 130.43
Total CHOL/HDL Ratio: 4
Triglycerides: 115 mg/dL (ref 0.0–149.0)
VLDL: 23 mg/dL (ref 0.0–40.0)

## 2024-05-25 LAB — PSA: PSA: 0.41 ng/mL (ref 0.10–4.00)

## 2024-05-25 LAB — MICROALBUMIN / CREATININE URINE RATIO
Creatinine,U: 160.2 mg/dL
Microalb Creat Ratio: 4.8 mg/g (ref 0.0–30.0)
Microalb, Ur: 0.8 mg/dL (ref 0.0–1.9)

## 2024-05-25 LAB — HEMOGLOBIN A1C: Hgb A1c MFr Bld: 7.4 % — ABNORMAL HIGH (ref 4.6–6.5)

## 2024-05-25 NOTE — Progress Notes (Signed)
 Established Patient Office Visit     CC/Reason for Visit: Annual preventive exam, right carpal tunnel  HPI: Bill [name] is a 58 y.o. male who is coming in today for the above mentioned reasons. Past Medical History is significant for: Hypertension, hyperlipidemia, type 2 diabetes, obesity, gout.  He has been feeling well with the exception of continued symptoms of carpal tunnel syndrome on the right.  He does not like wearing the wrist splint.  Is requesting referral to a specialist.  Has routine eye and dental care.  Immunizations and cancer screening are up-to-date.   Past Medical/Surgical History: Past Medical History:  Diagnosis Date   Arthritis    left leg   Diabetes mellitus without complication (HCC)    TYPE 2    GERD (gastroesophageal reflux disease)    Gout    Hypertension     Past Surgical History:  Procedure Laterality Date   DG OPERATIVE RIGHT HIP ([facility] HX)     TOTAL HIP ARTHROPLASTY Right 10/16/2020   Procedure: RIGHT POSTERIOR TOTAL HIP ARTHROPLASTY AND REMOVAL OF HARDWARE;  Surgeon: Liam Lerner, MD;  Location: [facility];  Service: Orthopedics;  Laterality: Right;    Social History:  reports that he has never smoked. He has never used smokeless tobacco. He reports that he does not currently use alcohol. He reports that he does not use drugs.  Allergies: Allergies  Allergen Reactions   Dog Epithelium     Dog hair and dander    Family History:  Family History  Problem Relation Age of Onset   Diabetes Father    Colon cancer Neg Hx    Esophageal cancer Neg Hx    Rectal cancer Neg Hx    Stomach cancer Neg Hx      Current Outpatient Medications:    allopurinol  (ZYLOPRIM ) 300 MG tablet, TAKE 1 TABLET BY MOUTH EVERY DAY, Disp: 90 tablet, Rfl: 1   amLODipine  (NORVASC ) 5 MG tablet, Take 1 tablet (5 mg total) by mouth daily., Disp: 90 tablet, Rfl: 1   atorvastatin  (LIPITOR) 80 MG tablet, TAKE 1 TABLET BY MOUTH EVERY DAY, Disp: 90 tablet, Rfl: 0    carvedilol  (COREG ) 3.125 MG tablet, TAKE 1 TABLET BY MOUTH TWICE A DAY WITH A MEAL, Disp: 180 tablet, Rfl: 1   Continuous Blood Gluc Receiver (FREESTYLE LIBRE 2 READER) DEVI, 1 each by Does not apply route daily., Disp: 1 each, Rfl: 2   Continuous Blood Gluc Sensor (FREESTYLE LIBRE 2 SENSOR) MISC, 1 each by Does not apply route daily., Disp: 1 each, Rfl: 12   ezetimibe  (ZETIA ) 10 MG tablet, Take 1 tablet (10 mg total) by mouth daily., Disp: 90 tablet, Rfl: 1   indomethacin  (INDOCIN ) 50 MG capsule, TAKE 1 CAPSULE(50 MG) BY MOUTH THREE TIMES DAILY AS NEEDED FOR GOUT PAIN, Disp: 90 capsule, Rfl: 0   pantoprazole  (PROTONIX ) 40 MG tablet, TAKE 1 TABLET BY MOUTH EVERY DAY, Disp: 90 tablet, Rfl: 1   valsartan  (DIOVAN ) 320 MG tablet, Take 1 tablet (320 mg total) by mouth daily., Disp: 90 tablet, Rfl: 1   meloxicam  (MOBIC ) 7.5 MG tablet, TAKE 1 TABLET BY MOUTH EVERY DAY, Disp: 30 tablet, Rfl: 0  Review of Systems:  Negative unless indicated in HPI.   Physical Exam: Vitals:   05/25/24 1029  BP: 130/80  Pulse: 65  Temp: 98.3 F (36.8 C)  TempSrc: Oral  SpO2: 99%  Weight: 250 lb 4.8 oz (113.5 kg)  Height: 5' 10 (1.778 m)  Body mass index is 35.91 kg/m.   Physical Exam Vitals reviewed.  Constitutional:      General: He is not in acute distress.    Appearance: Normal appearance. He is obese. He is not ill-appearing, toxic-appearing or diaphoretic.  HENT:     Head: Normocephalic.     Right Ear: Tympanic membrane, ear canal and external ear normal. There is no impacted cerumen.     Left Ear: Tympanic membrane, ear canal and external ear normal. There is no impacted cerumen.     Nose: Nose normal.     Mouth/Throat:     Mouth: Mucous membranes are moist.     Pharynx: Oropharynx is clear. No oropharyngeal exudate or posterior oropharyngeal erythema.  Eyes:     General: No scleral icterus.       Right eye: No discharge.        Left eye: No discharge.     Conjunctiva/sclera: Conjunctivae  normal.     Pupils: Pupils are equal, round, and reactive to light.  Neck:     Vascular: No carotid bruit.  Cardiovascular:     Rate and Rhythm: Normal rate and regular rhythm.     Pulses: Normal pulses.     Heart sounds: Normal heart sounds.  Pulmonary:     Effort: Pulmonary effort is normal. No respiratory distress.     Breath sounds: Normal breath sounds.  Abdominal:     General: Abdomen is flat. Bowel sounds are normal.     Palpations: Abdomen is soft.  Musculoskeletal:        General: Normal range of motion.     Cervical back: Normal range of motion.  Skin:    General: Skin is warm and dry.  Neurological:     General: No focal deficit present.     Mental Status: He is alert and oriented to person, place, and time. Mental status is at baseline.  Psychiatric:        Mood and Affect: Mood normal.        Behavior: Behavior normal.        Thought Content: Thought content normal.        Judgment: Judgment normal.       Impression and Plan:  Encounter for preventive health examination  Type 2 diabetes mellitus with other specified complication, without long-term current use of insulin  (HCC) -     CBC with Differential/Platelet; Future -     Comprehensive metabolic panel with GFR; Future -     Hemoglobin A1c; Future -     Microalbumin / creatinine urine ratio; Future  Primary hypertension  Mixed hyperlipidemia -     Lipid panel; Future  Vitamin D  deficiency -     PSA; Future  Transaminitis  Idiopathic chronic gout of left ankle without tophus  Right carpal tunnel syndrome -     Ambulatory referral to Sports Medicine   -Recommend routine eye and dental care. -Healthy lifestyle discussed in detail. -Labs to be updated today. -Prostate cancer screening: PSA today Health Maintenance  Topic Date Due   Yearly kidney health urinalysis for diabetes  Never done   Hepatitis B Vaccine (1 of 3 - 19+ 3-dose series) Never done   COVID-19 Vaccine (4 - 2024-25 season)  07/06/2023   Eye exam for diabetics  07/30/2023   Yearly kidney function blood test for diabetes  05/21/2024   Flu Shot  06/04/2024   Hemoglobin A1C  06/30/2024   Complete foot exam   05/25/2025  Colon Cancer Screening  01/11/2030   DTaP/Tdap/Td vaccine (2 - Td or Tdap) 07/11/2031   Pneumococcal Vaccination  Completed   Hepatitis C Screening  Completed   HIV Screening  Completed   Zoster (Shingles) Vaccine  Completed   HPV Vaccine  Aged Out   Meningitis B Vaccine  Aged Out         Suraya Vidrine Theophilus Andrews, MD [facility] at [facility]

## 2024-05-26 ENCOUNTER — Encounter: Payer: Self-pay | Admitting: Internal Medicine

## 2024-05-26 ENCOUNTER — Ambulatory Visit: Payer: Self-pay | Admitting: Internal Medicine

## 2024-05-26 DIAGNOSIS — E782 Mixed hyperlipidemia: Secondary | ICD-10-CM

## 2024-06-16 NOTE — Progress Notes (Signed)
 [name] [address] [place] [address] Phone: [phone]   Assessment and Plan:     1. Right hand pain 2. Carpal tunnel syndrome, right -Chronic with exacerbation, initial visit - Consistent with right carpal tunnel x 8 months.  Paresthesia is unilaterally in right hand and only present in digits 1 through 4 consistent with median nerve distribution and not consistent with diabetic neuropathy - Start HEP for carpal tunnel syndrome - Patient elected for carpal tunnel CSI.  Tolerated well per note below.  CSI might temporarily increase blood glucose in patient with past medical history of DM type II  Procedure: Ultrasound Guided Carpal Tunnel Injection Side: Right Diagnosis: Carpal tunnel US  Indication:  - accuracy is paramount for diagnosis - to ensure therapeutic efficacy or procedural success - to reduce procedural risk  After explaining the procedure, viable alternatives, risks, and answering any questions, consent was given verbally. The site was cleaned with chlorhexidine  prep. An ultrasound transducer was placed on the volar aspect of the wrist.  The median nerve was identified.   An injection was performed under ultrasound guidance from the ulnar approach with care used to avoid ulnar artery.  1ml of 1% lidocaine  without epinephrine  was used to hydrodissect the median nerve from the retinaculum.  40 mg of triamcinolone  (KENALOG ) 40mg /ml was then deposited adjacent to but not into the median nerve.  This was well tolerated and resulted in  relief.  Needle was removed and dressing placed and post injection instructions were given including  a discussion of likely return of pain today after the anesthetic wears off (with the possibility of worsened pain) until the steroid starts to work in 1-3 days.   Pt was advised to call or return to clinic if these symptoms worsen or fail to improve as anticipated. Images permanently  stored.  15 additional minutes spent for educating Therapeutic Home Exercise Program.  This included exercises focusing on stretching, strengthening, with focus on eccentric aspects.   Long term goals include an improvement in range of motion, strength, endurance as well as avoiding reinjury. Patient's frequency would include in 1-2 times a day, 3-5 times a week for a duration of 6-12 weeks. Proper technique shown and discussed handout in great detail with ATC.  All questions were discussed and answered.     Pertinent previous records reviewed include none  Follow Up: 6 to 8 weeks for reevaluation.  Could consider repeat carpal tunnel CSI versus referral to orthopedic hand surgery.   Subjective:   I, Bill [name], am serving as a Neurosurgeon for Doctor Morene Mace  Chief Complaint: R hand pain   HPI:   06/17/2024 Patient is a 58 year old male with right hand pain. Patient states r hand tingling started in December. Grip strength intact. No pain. Tingling in 4 tops of fingers not the pinky    Relevant Historical Information: DM type II, CKD  Additional pertinent review of systems negative.   Current Outpatient Medications:    allopurinol  (ZYLOPRIM ) 300 MG tablet, TAKE 1 TABLET BY MOUTH EVERY DAY, Disp: 90 tablet, Rfl: 1   amLODipine  (NORVASC ) 5 MG tablet, Take 1 tablet (5 mg total) by mouth daily., Disp: 90 tablet, Rfl: 1   atorvastatin  (LIPITOR) 80 MG tablet, TAKE 1 TABLET BY MOUTH EVERY DAY, Disp: 90 tablet, Rfl: 0   carvedilol  (COREG ) 3.125 MG tablet, TAKE 1 TABLET BY MOUTH TWICE A DAY WITH A MEAL, Disp: 180 tablet, Rfl: 1  Continuous Blood Gluc Receiver (FREESTYLE LIBRE 2 READER) DEVI, 1 each by Does not apply route daily., Disp: 1 each, Rfl: 2   Continuous Blood Gluc Sensor (FREESTYLE LIBRE 2 SENSOR) MISC, 1 each by Does not apply route daily., Disp: 1 each, Rfl: 12   ezetimibe (ZETIA) 10 MG tablet, Take 1 tablet (10 mg total) by mouth daily., Disp: 90 tablet, Rfl: 1    indomethacin (INDOCIN) 50 MG capsule, TAKE 1 CAPSULE(50 MG) BY MOUTH THREE TIMES DAILY AS NEEDED FOR GOUT PAIN, Disp: 90 capsule, Rfl: 0   meloxicam (MOBIC) 7.5 MG tablet, TAKE 1 TABLET BY MOUTH EVERY DAY, Disp: 30 tablet, Rfl: 0   pantoprazole (PROTONIX) 40 MG tablet, TAKE 1 TABLET BY MOUTH EVERY DAY, Disp: 90 tablet, Rfl: 1   valsartan (DIOVAN) 320 MG tablet, Take 1 tablet (320 mg total) by mouth daily., Disp: 90 tablet, Rfl: 1   Objective:     Vitals:   06/17/24 1441  Pulse: 84  SpO2: 96%  Weight: 247 lb (112 kg)  Height: 5' 10 (1.778 m)      Body mass index is 35.44 kg/m.    Physical Exam:    General: Appears well, nad, nontoxic and pleasant Neuro:sensation intact, strength is 5/5 in upper extremities, muscle tone wnl Skin:no susupicious lesions or rashes  Right hand/Wrist:   No deformity or swelling appreciated. ROM  Ext 90, flexion 70, radial/ulnar deviation 30 nttp over the snuff box, dorsal carpals, volar carpals, radial styloid, ulnar styloid, 1st mcp, tfcc Positive Tinel's, Phalen's, Prayer Tests Negative finklestein Neg tfcc bounce test No pain with resisted ext, flex or deviation    Electronically signed by:  Odis Mace [name] 3:00 PM 06/17/24

## 2024-06-17 ENCOUNTER — Other Ambulatory Visit: Payer: Self-pay

## 2024-06-17 ENCOUNTER — Ambulatory Visit: Admitting: Sports Medicine

## 2024-06-17 VITALS — HR 84 | Ht 70.0 in | Wt 247.0 lb

## 2024-06-17 DIAGNOSIS — G5601 Carpal tunnel syndrome, right upper limb: Secondary | ICD-10-CM

## 2024-06-17 DIAGNOSIS — M79641 Pain in right hand: Secondary | ICD-10-CM

## 2024-06-17 NOTE — Patient Instructions (Signed)
 Wrist HEP  6-8 week follow up

## 2024-07-21 ENCOUNTER — Encounter: Payer: Self-pay | Admitting: Internal Medicine

## 2024-07-21 DIAGNOSIS — I10 Essential (primary) hypertension: Secondary | ICD-10-CM

## 2024-07-21 DIAGNOSIS — M1A0720 Idiopathic chronic gout, left ankle and foot, without tophus (tophi): Secondary | ICD-10-CM

## 2024-07-21 DIAGNOSIS — E782 Mixed hyperlipidemia: Secondary | ICD-10-CM

## 2024-07-21 MED ORDER — VALSARTAN 320 MG PO TABS: 320.0000 mg | ORAL_TABLET | Freq: Every day | ORAL | 1 refills | Status: AC

## 2024-07-21 MED ORDER — ALLOPURINOL 300 MG PO TABS: 300.0000 mg | ORAL_TABLET | Freq: Every day | ORAL | 1 refills | Status: AC

## 2024-07-21 MED ORDER — CARVEDILOL 3.125 MG PO TABS: 3.1250 mg | ORAL_TABLET | Freq: Two times a day (BID) | ORAL | 1 refills | Status: AC

## 2024-07-21 MED ORDER — ATORVASTATIN CALCIUM 80 MG PO TABS: 80.0000 mg | ORAL_TABLET | Freq: Every day | ORAL | 1 refills | Status: AC

## 2024-07-28 NOTE — Progress Notes (Deleted)
    Ben Jackson D.[facility] [address] [place] [address] Phone: [phone]   Assessment and Plan:     ***    Pertinent previous records reviewed include ***   Follow Up: ***     Subjective:   I, Marializ Ferrebee, am serving as a Neurosurgeon for Doctor Morene Mace   Chief Complaint: R hand pain    HPI:    06/17/2024 Patient is a 58 year old male with right hand pain. Patient states r hand tingling started in December. Grip strength intact. No pain. Tingling in 4 tops of fingers not the pinky    07/29/2024 Patient states   Relevant Historical Information: DM type II, CKD  Additional pertinent review of systems negative.   Current Outpatient Medications:    allopurinol  (ZYLOPRIM ) 300 MG tablet, Take 1 tablet (300 mg total) by mouth daily., Disp: 90 tablet, Rfl: 1   amLODipine  (NORVASC ) 5 MG tablet, Take 1 tablet (5 mg total) by mouth daily., Disp: 90 tablet, Rfl: 1   atorvastatin  (LIPITOR) 80 MG tablet, Take 1 tablet (80 mg total) by mouth daily., Disp: 90 tablet, Rfl: 1   carvedilol  (COREG ) 3.125 MG tablet, Take 1 tablet (3.125 mg total) by mouth 2 (two) times daily with a meal., Disp: 180 tablet, Rfl: 1   Continuous Blood Gluc Receiver (FREESTYLE LIBRE 2 READER) DEVI, 1 each by Does not apply route daily., Disp: 1 each, Rfl: 2   Continuous Blood Gluc Sensor (FREESTYLE LIBRE 2 SENSOR) MISC, 1 each by Does not apply route daily., Disp: 1 each, Rfl: 12   ezetimibe  (ZETIA ) 10 MG tablet, Take 1 tablet (10 mg total) by mouth daily., Disp: 90 tablet, Rfl: 1   indomethacin  (INDOCIN ) 50 MG capsule, TAKE 1 CAPSULE(50 MG) BY MOUTH THREE TIMES DAILY AS NEEDED FOR GOUT PAIN, Disp: 90 capsule, Rfl: 0   meloxicam  (MOBIC ) 7.5 MG tablet, TAKE 1 TABLET BY MOUTH EVERY DAY, Disp: 30 tablet, Rfl: 0   pantoprazole  (PROTONIX ) 40 MG tablet, TAKE 1 TABLET BY MOUTH EVERY DAY, Disp: 90 tablet, Rfl: 1   valsartan  (DIOVAN ) 320 MG tablet, Take 1 tablet (320  mg total) by mouth daily., Disp: 90 tablet, Rfl: 1   Objective:     There were no vitals filed for this visit.    There is no height or weight on file to calculate BMI.    Physical Exam:    ***   Electronically signed by:  Odis Mace [facility] 7:38 AM 07/28/24

## 2024-07-29 ENCOUNTER — Other Ambulatory Visit (HOSPITAL_BASED_OUTPATIENT_CLINIC_OR_DEPARTMENT_OTHER): Payer: Self-pay

## 2024-07-29 ENCOUNTER — Ambulatory Visit: Admitting: Sports Medicine

## 2024-07-29 MED ORDER — FLUZONE 0.5 ML IM SUSY
0.5000 mL | PREFILLED_SYRINGE | Freq: Once | INTRAMUSCULAR | 0 refills | Status: AC
Start: 2024-07-29 — End: 2024-07-30
  Filled 2024-07-29: qty 0.5, 1d supply, fill #0

## 2024-08-10 ENCOUNTER — Encounter (HOSPITAL_BASED_OUTPATIENT_CLINIC_OR_DEPARTMENT_OTHER): Payer: Self-pay | Admitting: Internal Medicine

## 2024-08-10 ENCOUNTER — Ambulatory Visit (INDEPENDENT_AMBULATORY_CARE_PROVIDER_SITE_OTHER): Admitting: Internal Medicine

## 2024-08-10 VITALS — BP 124/82 | HR 66 | Ht 71.0 in | Wt 240.5 lb

## 2024-08-10 DIAGNOSIS — I10 Essential (primary) hypertension: Secondary | ICD-10-CM

## 2024-08-10 DIAGNOSIS — E119 Type 2 diabetes mellitus without complications: Secondary | ICD-10-CM | POA: Diagnosis not present

## 2024-08-10 DIAGNOSIS — E785 Hyperlipidemia, unspecified: Secondary | ICD-10-CM

## 2024-08-10 NOTE — Patient Instructions (Signed)
 Medication Instructions:   Your physician recommends that you continue on your current medications as directed. Please refer to the Current Medication list given to you today.  *If you need a refill on your cardiac medications before your next appointment, please call your pharmacy*    Testing/Procedures:  CARDIAC CALCIUM  SCORE (SELF PAY)--YOU CAN CALL OUR CT DEPARTMENT AT [phone] THIS APPOINTMENT OR WE CAN HAVE OUR SCHEDULER REACH OUT TO YOU THIS AFTERNOON TO COORDINATE THIS APPOINTMENT FOR YOU.  THIS TEST WILL BE DONE HERE AT [facility]   Follow-Up:  TO BE DETERMINED BASED ON CALCIUM  SCORE RESULTS

## 2024-08-10 NOTE — Progress Notes (Signed)
 LIPID CLINIC CONSULT NOTE  Chief Complaint:  Manage dyslipidemia  Primary Care Physician: Theophilus Andrews, Tully GRADE, MD  Primary Cardiologist:  None  HPI:  Bill [name] is a 58 y.o. male who is being seen today for the evaluation of dyslipidemia at the request of Theophilus Andrews, Jonna*.  This is a pleasant 58 year old male, referred for evaluation management of dyslipidemia.  He does have a history of type 2 diabetes with recent A1c 7.4%, hypertension and high cholesterol but no family history of early onset heart disease.  He had an untreated LDL of 192 previously that improved to 163 on statin therapy and more recently he has been on combination of atorvastatin  80 mg daily and ezetimibe  10 mg daily.  His last lipid profile in July showed total cholesterol 174, triglycerides 115, HDL 44 and LDL 107.  His target LDL is less than 70.  He has no known cardiovascular disease.  Overall diet is quite varied however he will work to lower saturated fats.  He tries to avoid carbohydrates.  PMHx:  Past Medical History:  Diagnosis Date   Arthritis    left leg   Diabetes mellitus without complication (HCC)    TYPE 2    GERD (gastroesophageal reflux disease)    Gout    Hypertension     Past Surgical History:  Procedure Laterality Date   DG OPERATIVE RIGHT HIP ([facility] HX)     TOTAL HIP ARTHROPLASTY Right 10/16/2020   Procedure: RIGHT POSTERIOR TOTAL HIP ARTHROPLASTY AND REMOVAL OF HARDWARE;  Surgeon: Liam Lerner, MD;  Location: [facility];  Service: Orthopedics;  Laterality: Right;    FAMHx:  Family History  Problem Relation Age of Onset   Cancer Mother        duodenal cancer   Hypertension Father    Diabetes Father    Gout Father    Colon cancer Neg Hx    Esophageal cancer Neg Hx    Rectal cancer Neg Hx    Stomach cancer Neg Hx     SOCHx:   reports that he has never smoked. He has never used smokeless tobacco. He reports that he does not currently use alcohol. He reports that  he does not use drugs.  ALLERGIES:  Allergies  Allergen Reactions   Dog Epithelium     Dog hair and dander    ROS: Pertinent items noted in HPI and remainder of comprehensive ROS otherwise negative.  HOME MEDS: Current Outpatient Medications on File Prior to Visit  Medication Sig Dispense Refill   allopurinol  (ZYLOPRIM ) 300 MG tablet Take 1 tablet (300 mg total) by mouth daily. 90 tablet 1   amLODipine  (NORVASC ) 5 MG tablet Take 1 tablet (5 mg total) by mouth daily. 90 tablet 1   atorvastatin  (LIPITOR) 80 MG tablet Take 1 tablet (80 mg total) by mouth daily. 90 tablet 1   carvedilol  (COREG ) 3.125 MG tablet Take 1 tablet (3.125 mg total) by mouth 2 (two) times daily with a meal. 180 tablet 1   Continuous Blood Gluc Receiver (FREESTYLE LIBRE 2 READER) DEVI 1 each by Does not apply route daily. 1 each 2   Continuous Blood Gluc Sensor (FREESTYLE LIBRE 2 SENSOR) MISC 1 each by Does not apply route daily. 1 each 12   ezetimibe  (ZETIA ) 10 MG tablet Take 1 tablet (10 mg total) by mouth daily. 90 tablet 1   indomethacin  (INDOCIN ) 50 MG capsule TAKE 1 CAPSULE(50 MG) BY MOUTH THREE TIMES DAILY AS NEEDED FOR  GOUT PAIN 90 capsule 0   meloxicam  (MOBIC ) 7.5 MG tablet TAKE 1 TABLET BY MOUTH EVERY DAY 30 tablet 0   pantoprazole  (PROTONIX ) 40 MG tablet TAKE 1 TABLET BY MOUTH EVERY DAY 90 tablet 1   valsartan  (DIOVAN ) 320 MG tablet Take 1 tablet (320 mg total) by mouth daily. 90 tablet 1   No current facility-administered medications on file prior to visit.    LABS/IMAGING: No results found for this or any previous visit (from the past 48 hours). No results found.  LIPID PANEL:    Component Value Date/Time   CHOL 174 05/25/2024 1047   CHOL 224 (H) 12/01/2015 1018   TRIG 115.0 05/25/2024 1047   HDL 44.00 05/25/2024 1047   HDL 38 (L) 12/01/2015 1018   CHOLHDL 4 05/25/2024 1047   VLDL 23.0 05/25/2024 1047   LDLCALC 107 (H) 05/25/2024 1047   LDLCALC 160 (H) 12/01/2015 1018    No results  found for: LIPOA   WEIGHTS: Wt Readings from Last 3 Encounters:  08/10/24 240 lb 8 oz (109.1 kg)  06/17/24 247 lb (112 kg)  05/25/24 250 lb 4.8 oz (113.5 kg)    VITALS: BP 124/82   Pulse 66   Ht 5' 11 (1.803 m)   Wt 240 lb 8 oz (109.1 kg)   SpO2 97%   BMI 33.54 kg/m   EXAM: Deferred  EKG: Deferred  ASSESSMENT: Dyslipidemia, goal LDL less than 70 History of LDL greater than 190, possible familial hyperlipidemia Type 2 diabetes-A1c 7.4% Hypertension  PLAN: 1.   Mr. Bunda has a dyslipidemia with target LDL less than 70.  He is currently on high-dose atorvastatin  and ezetimibe  but remains above goal.  Diet perhaps can be improved somewhat and we discussed ways to lower saturated fats today.  I suspect he may need additional pharmacotherapy.  I have offered a calcium  score which I think can better risk ratified him.  We also discussed possibilities such as a PCSK9 inhibitor or adding Nexletol to his ezetimibe  for additional benefit.  He would like to continue to work on diet and lifestyle choices and in light of his calcium  score consider therapy options.  Would be reasonable to assess LP(a) as well with his upcoming labs.  Thanks again for the kind referral.  Bill KYM Maxcy, MD, [facility], FNLA, FACP  [place]  [facility]  Medical Director of the [facility]  [facility] Diplomate of the American Board of Clinical Lipidology Attending Cardiologist  Direct Dial: [phone]  Fax: [phone]  Website:  [email or web address] Bill [name] Raya Mckinstry 08/10/2024, 11:56 AM

## 2024-10-22 ENCOUNTER — Ambulatory Visit
Admission: RE | Admit: 2024-10-22 | Discharge: 2024-10-22 | Disposition: A | Discharge status: Home / Self Care | Payer: Self-pay | Source: Ambulatory Visit | Attending: Internal Medicine | Admitting: Internal Medicine

## 2024-10-22 DIAGNOSIS — E785 Hyperlipidemia, unspecified: Secondary | ICD-10-CM

## 2024-10-25 ENCOUNTER — Ambulatory Visit: Payer: Self-pay | Admitting: Internal Medicine
